# Patient Record
Sex: Female | Born: 1985
Health system: Southern US, Community
[De-identification: ages and names within clinical notes are randomized; demographics above are authoritative.]

## PROBLEM LIST (undated history)

## (undated) DIAGNOSIS — IMO0002 Reserved for concepts with insufficient information to code with codable children: Secondary | ICD-10-CM

## (undated) DIAGNOSIS — D649 Anemia, unspecified: Secondary | ICD-10-CM

## (undated) DIAGNOSIS — T7840XA Allergy, unspecified, initial encounter: Secondary | ICD-10-CM

## (undated) HISTORY — DX: Reserved for concepts with insufficient information to code with codable children: IMO0002

## (undated) HISTORY — DX: Allergy, unspecified, initial encounter: T78.40XA

## (undated) HISTORY — PX: OTHER SURGICAL HISTORY: SHX169

## (undated) HISTORY — DX: Anemia, unspecified: D64.9

---

## 2019-12-19 DIAGNOSIS — M79672 Pain in left foot: Secondary | ICD-10-CM | POA: Diagnosis not present

## 2019-12-19 DIAGNOSIS — M25572 Pain in left ankle and joints of left foot: Secondary | ICD-10-CM | POA: Diagnosis not present

## 2020-11-27 ENCOUNTER — Emergency Department (HOSPITAL_COMMUNITY)
Admission: EM | Admit: 2020-11-27 | Discharge: 2020-11-27 | Disposition: A | Discharge status: Home / Self Care | Payer: Federal, State, Local not specified - PPO | Attending: Emergency Medicine | Admitting: Emergency Medicine

## 2020-11-27 ENCOUNTER — Encounter (HOSPITAL_COMMUNITY): Payer: Self-pay | Admitting: Emergency Medicine

## 2020-11-27 DIAGNOSIS — N946 Dysmenorrhea, unspecified: Secondary | ICD-10-CM | POA: Insufficient documentation

## 2020-11-27 DIAGNOSIS — R1084 Generalized abdominal pain: Secondary | ICD-10-CM | POA: Diagnosis not present

## 2020-11-27 DIAGNOSIS — M545 Low back pain, unspecified: Secondary | ICD-10-CM | POA: Diagnosis not present

## 2020-11-27 LAB — CBC WITH DIFFERENTIAL/PLATELET
Abs Immature Granulocytes: 0.02 10*3/uL (ref 0.00–0.07)
Basophils Absolute: 0 10*3/uL (ref 0.0–0.1)
Basophils Relative: 1 %
Eosinophils Absolute: 0.1 10*3/uL (ref 0.0–0.5)
Eosinophils Relative: 1 %
HCT: 36.5 % (ref 36.0–46.0)
Hemoglobin: 11.9 g/dL — ABNORMAL LOW (ref 12.0–15.0)
Immature Granulocytes: 0 %
Lymphocytes Relative: 27 %
Lymphs Abs: 1.8 10*3/uL (ref 0.7–4.0)
MCH: 28.5 pg (ref 26.0–34.0)
MCHC: 32.6 g/dL (ref 30.0–36.0)
MCV: 87.5 fL (ref 80.0–100.0)
Monocytes Absolute: 0.4 10*3/uL (ref 0.1–1.0)
Monocytes Relative: 5 %
Neutro Abs: 4.6 10*3/uL (ref 1.7–7.7)
Neutrophils Relative %: 66 %
Platelets: 408 10*3/uL — ABNORMAL HIGH (ref 150–400)
RBC: 4.17 MIL/uL (ref 3.87–5.11)
RDW: 15.9 % — ABNORMAL HIGH (ref 11.5–15.5)
WBC: 6.9 10*3/uL (ref 4.0–10.5)
nRBC: 0 % (ref 0.0–0.2)

## 2020-11-27 LAB — COMPREHENSIVE METABOLIC PANEL
ALT: 14 U/L (ref 0–44)
AST: 14 U/L — ABNORMAL LOW (ref 15–41)
Albumin: 3.7 g/dL (ref 3.5–5.0)
Alkaline Phosphatase: 45 U/L (ref 38–126)
Anion gap: 9 (ref 5–15)
BUN: 7 mg/dL (ref 6–20)
CO2: 23 mmol/L (ref 22–32)
Calcium: 8.9 mg/dL (ref 8.9–10.3)
Chloride: 106 mmol/L (ref 98–111)
Creatinine, Ser: 0.81 mg/dL (ref 0.44–1.00)
GFR, Estimated: >60 mL/min (ref >60)
Glucose, Bld: 98 mg/dL (ref 70–99)
Potassium: 3.8 mmol/L (ref 3.5–5.1)
Sodium: 138 mmol/L (ref 135–145)
Total Bilirubin: 0.6 mg/dL (ref 0.3–1.2)
Total Protein: 6.6 g/dL (ref 6.5–8.1)

## 2020-11-27 LAB — LIPASE, BLOOD: Lipase: 42 U/L (ref 11–51)

## 2020-11-27 NOTE — ED Triage Notes (Signed)
Pt sent from UC for further workup. Last week pt was having nausea, lack of appetite and low back pain. Pt not having any complaints at this time. Pt on cycle at this time and having some cramps.

## 2020-11-27 NOTE — Discharge Instructions (Addendum)
It was a pleasure caring for you today!  You came in for prior abdominal pain, which I am glad has resolved.  Your urine test today did not show any signs of kidney infection or UTI.  Normal blood test to check your kidneys and blood counts.  Recommend following up with a PCP and establishing care in order to follow-up on your irregular menstrual cycles and symptoms.  If interested you can contact the family medicine clinic across the street to establish care. phone [phone] at [address].

## 2020-11-27 NOTE — ED Provider Notes (Signed)
Emergency Medicine Provider Triage Evaluation Note  Madison Mejia , a 35 y.o. female  was evaluated in triage.  Pt complains of nausea she has been having multiple episodes of emesis for the last week.  States that she had abdominal pain that radiated to the back, but that is resolved.  She had 1 episode of hematemesis earlier today.  She has not had shortness of breath or chest pain.  No dysuria today, but had some last week.  No hematuria, states her cycles have been closer together than normal..  Review of Systems  Positive: Nausea, vomiting, hematemesis  Negative: Chest pain, shortness of breath  Physical Exam  BP (!) 129/93   Pulse 84   Temp 98.2 F (36.8 C) (Oral)   Resp 14   Ht 5\' 3"  (1.6 m)   Wt 68 kg   SpO2 100%   BMI 26.57 kg/m  Gen:   Awake, no distress   Resp:  Normal effort  MSK:   Moves extremities without difficulty  Other:  No CVA tenderness  Medical Decision Making  Medically screening exam initiated at 12:23 PM.  Appropriate orders placed.  Maire Govan was informed that the remainder of the evaluation will be completed by another provider, this initial triage assessment does not replace that evaluation, and the importance of remaining in the ED until their evaluation is complete.     Lilia Pro, PA-C 11/27/20 1224    11/29/20, MD 11/27/20 [phone]

## 2020-11-27 NOTE — ED Provider Notes (Signed)
[facility] EMERGENCY DEPARTMENT Provider Note   CSN: [id number] Arrival date & time: 11/27/20  1209     History Chief Complaint  Patient presents with   Nausea    Madison Mejia is a 35 y.o. female with no PMH who presents for abdominal pain that started yesterday and has since resolved. She states the pain was diffuse and similar to her menstrual cramps. She then started her cycle yesterday which is abnormal for her since she was just on it 3 weeks ago. She also endorses 3 days of nausea and vomiting about a week ago that has also resolved. States she noted a little blood when she threw up last week. Denies burning or frequent urination, and has no concern for STI. No recent fever or infection. Denis CP, SOB, palpitations. Normal BM and urination.   Denies cigarette or recreational drug use. Occasional alcohol use. Does not have a PCP. Does not take any medication.    History reviewed. No pertinent past medical history.  There are no problems to display for this patient.   History reviewed. No pertinent surgical history.   OB History   No obstetric history on file.     No family history on file.     Home Medications Prior to Admission medications   Not on File    Allergies    Patient has no allergy information on record.  Review of Systems   Review of Systems  Gastrointestinal:  Positive for abdominal pain.  Genitourinary:  Positive for vaginal bleeding. Negative for dysuria.  Neurological:  Negative for dizziness, weakness and light-headedness.  All other systems reviewed and are negative.  Physical Exam Updated Vital Signs BP 113/80   Pulse 80   Temp 98.2 F (36.8 C) (Oral)   Resp 14   Ht 5\' 3"  (1.6 m)   Wt 68 kg   SpO2 99%   BMI 26.57 kg/m   Physical Exam Constitutional:      General: She is not in acute distress.    Appearance: Normal appearance.  HENT:     Head: Normocephalic and atraumatic.  Eyes:     Extraocular  Movements: Extraocular movements intact.     Conjunctiva/sclera: Conjunctivae normal.  Cardiovascular:     Rate and Rhythm: Normal rate and regular rhythm.     Pulses: Normal pulses.     Heart sounds: Normal heart sounds.  Pulmonary:     Effort: Pulmonary effort is normal.     Breath sounds: Normal breath sounds.  Abdominal:     General: There is no distension.     Palpations: Abdomen is soft.     Tenderness: There is no abdominal tenderness.  Musculoskeletal:     Cervical back: Neck supple.  Skin:    General: Skin is warm and dry.  Neurological:     General: No focal deficit present.     Mental Status: She is alert.  Psychiatric:        Mood and Affect: Mood normal.        Behavior: Behavior normal.    ED Results / Procedures / Treatments   Labs (all labs ordered are listed, but only abnormal results are displayed) Labs Reviewed  CBC WITH DIFFERENTIAL/PLATELET - Abnormal; Notable for the following components:      Result Value   Hemoglobin 11.9 (*)    RDW 15.9 (*)    Platelets 408 (*)    All other components within normal limits  COMPREHENSIVE METABOLIC PANEL - Abnormal;  Notable for the following components:   AST 14 (*)    All other components within normal limits  LIPASE, BLOOD    EKG None  Radiology No results found.  Procedures Procedures   Medications Ordered in ED Medications - No data to display  ED Course  I have reviewed the triage vital signs and the nursing notes.  Pertinent labs & imaging results that were available during my care of the patient were reviewed by me and considered in my medical decision making (see chart for details).    MDM Rules/Calculators/A&P                           Patient is a 35 yo who presents after abdominal pain x1 day that has since resolved. Went to urgent care earlier today and did a pregnancy test which was negative and a UA with hematuria. Recommended coming to the ED for further workup.States she started her  menses yesterday which was a week early and pain likely attributable to her cycle. On presentation vitals are stable, she is well appearing, without abdominal pain, and with a normal physical exam. CMP and lipase normal. Hgb 11.9  Discharged patient with recommendation for PCP follow-up to check on her irregular menses. Discussed option of contraception for her bleeding.  Provided the contact information for the family medicine clinic. Patient had no further questions or concerns  Final Clinical Impression(s) / ED Diagnoses Final diagnoses:  Dysmenorrhea  Generalized abdominal pain    Rx / DC Orders ED Discharge Orders     None        Cora Collum, DO 11/27/20 1721    Gwyneth Sprout, MD 11/27/20 [phone]

## 2020-12-15 ENCOUNTER — Ambulatory Visit: Payer: Federal, State, Local not specified - PPO | Admitting: Internal Medicine

## 2020-12-15 ENCOUNTER — Encounter: Payer: Self-pay | Admitting: Internal Medicine

## 2020-12-15 ENCOUNTER — Other Ambulatory Visit: Payer: Self-pay

## 2020-12-15 VITALS — BP 111/68 | HR 70 | Ht 63.0 in | Wt 150.6 lb

## 2020-12-15 DIAGNOSIS — N946 Dysmenorrhea, unspecified: Secondary | ICD-10-CM

## 2020-12-15 MED ORDER — FLUOXETINE HCL 10 MG PO TABS: 10.0000 mg | ORAL_TABLET | Freq: Every day | ORAL | 3 refills | Status: DC

## 2020-12-15 NOTE — Addendum Note (Signed)
Addended by: Jobie Quaker on: 12/15/2020 02:56 PM   Modules accepted: Orders

## 2020-12-15 NOTE — Progress Notes (Signed)
New Patient Office Visit  Subjective:  Patient ID: Madison Mejia, female    DOB: [birth year]  Age: 35 y.o. MRN: [id number]  CC:  Chief Complaint  Patient presents with   Menstrual Problem    Patient having irregular periods. Patient states that they have came 3 weeks apart. Complains of cramping and back pain. Patient was seen at urgent care and had labs and neg pregnancy test.     HPI Patient presents for menstrual cramps  History reviewed. No pertinent past medical history.   Current Outpatient Medications:    FLUoxetine (PROZAC) 10 MG tablet, Take 1 tablet (10 mg total) by mouth daily., Disp: 90 tablet, Rfl: 3   History reviewed. No pertinent surgical history.  History reviewed. No pertinent family history.  Social History   Socioeconomic History   Marital status: Single    Spouse name: Not on file   Number of children: Not on file   Years of education: Not on file   Highest education level: Not on file  Occupational History   Not on file  Tobacco Use   Smoking status: Never   Smokeless tobacco: Never  Substance and Sexual Activity   Alcohol use: Never   Drug use: Never   Sexual activity: Not Currently  Other Topics Concern   Not on file  Social History Narrative   Not on file   Social Determinants of Health   Financial Resource Strain: Not on file  Food Insecurity: Not on file  Transportation Needs: Not on file  Physical Activity: Not on file  Stress: Not on file  Social Connections: Not on file  Intimate Partner Violence: Not on file    ROS Review of Systems  Constitutional: Negative.   HENT: Negative.    Eyes: Negative.   Respiratory: Negative.    Cardiovascular: Negative.   Gastrointestinal: Negative.   Endocrine: Negative.   Genitourinary:  Positive for menstrual problem and pelvic pain. Negative for hematuria, vaginal bleeding, vaginal discharge and vaginal pain.  Musculoskeletal: Negative.   Skin: Negative.   Allergic/Immunologic:  Negative.   Neurological: Negative.   Hematological: Negative.   Psychiatric/Behavioral: Negative.    All other systems reviewed and are negative.  Objective:   Today's Vitals: BP 111/68   Pulse 70   Ht 5\' 3"  (1.6 m)   Wt 150 lb 9.6 oz (68.3 kg)   BMI 26.68 kg/m   Physical Exam Constitutional:      Appearance: Normal appearance. She is normal weight.  HENT:     Head: Normocephalic.     Nose: Nose normal.     Mouth/Throat:     Mouth: Mucous membranes are moist.  Eyes:     Pupils: Pupils are equal, round, and reactive to light.  Cardiovascular:     Rate and Rhythm: Normal rate and regular rhythm.  Pulmonary:     Effort: Pulmonary effort is normal.     Breath sounds: No rhonchi.  Abdominal:     General: There is no distension.  Musculoskeletal:        General: Normal range of motion.     Cervical back: Normal range of motion.  Skin:    General: Skin is warm.  Neurological:     General: No focal deficit present.     Mental Status: She is alert.  Psychiatric:        Mood and Affect: Mood normal.    Assessment & Plan:   Problem List Items Addressed This Visit  Genitourinary   Dysmenorrhea - Primary    We will start the patient on Prozac also refer her to OB/GYN       Relevant Medications   FLUoxetine (PROZAC) 10 MG tablet    Outpatient Encounter Medications as of 12/15/2020  Medication Sig   FLUoxetine (PROZAC) 10 MG tablet Take 1 tablet (10 mg total) by mouth daily.   No facility-administered encounter medications on file as of 12/15/2020.    Follow-up: No follow-ups on file.   Corky Downs, MD

## 2020-12-15 NOTE — Assessment & Plan Note (Signed)
We will start the patient on Prozac also refer her to OB/GYN

## 2020-12-29 ENCOUNTER — Encounter: Payer: Self-pay | Admitting: Obstetrics and Gynecology

## 2021-07-13 ENCOUNTER — Other Ambulatory Visit: Payer: Self-pay

## 2021-07-13 ENCOUNTER — Other Ambulatory Visit (HOSPITAL_COMMUNITY)
Admission: RE | Admit: 2021-07-13 | Discharge: 2021-07-13 | Disposition: A | Discharge status: Home / Self Care | Payer: Federal, State, Local not specified - PPO | Source: Ambulatory Visit | Attending: Obstetrics & Gynecology | Admitting: Obstetrics & Gynecology

## 2021-07-13 ENCOUNTER — Ambulatory Visit (INDEPENDENT_AMBULATORY_CARE_PROVIDER_SITE_OTHER): Payer: Federal, State, Local not specified - PPO | Admitting: Obstetrics & Gynecology

## 2021-07-13 ENCOUNTER — Encounter: Payer: Self-pay | Admitting: Obstetrics & Gynecology

## 2021-07-13 VITALS — BP 104/70 | HR 84 | Ht 63.0 in | Wt 136.0 lb

## 2021-07-13 DIAGNOSIS — N946 Dysmenorrhea, unspecified: Secondary | ICD-10-CM | POA: Diagnosis not present

## 2021-07-13 DIAGNOSIS — Z30011 Encounter for initial prescription of contraceptive pills: Secondary | ICD-10-CM

## 2021-07-13 DIAGNOSIS — Z01419 Encounter for gynecological examination (general) (routine) without abnormal findings: Secondary | ICD-10-CM | POA: Diagnosis not present

## 2021-07-13 MED ORDER — LEVONORGESTREL-ETHINYL ESTRAD 0.15-30 MG-MCG PO TABS: 1.0000 | ORAL_TABLET | Freq: Every day | ORAL | 11 refills | Status: DC

## 2021-07-13 NOTE — Progress Notes (Signed)
Patient ID: Madison Mejia, female   DOB: [birth year], 36 y.o.   MRN: [id number] ? ?Chief Complaint  ?Patient presents with  ? Gynecologic Exam  ? ? ?HPI ?Madison Mejia is a 36 y.o. female.  [id number] ?Patient's last menstrual period was 06/18/2021 (exact date). ?For one year she has had premenstrual pain and nausea and vomiting with 15 lb weight loss. She also has occasional diarrhea and constipation. No birth control method as she does not have a female partner for several years. She used OCP in college and did well with this. ?HPI ? ?Past Medical History:  ?Diagnosis Date  ? Anemia   ? ? ?History reviewed. No pertinent surgical history. ? ?History reviewed. No pertinent family history. ? ?Social History ?Social History  ? ?Tobacco Use  ? Smoking status: Never  ? Smokeless tobacco: Never  ?Vaping Use  ? Vaping Use: Never used  ?Substance Use Topics  ? Alcohol use: Yes  ? Drug use: Never  ? ? ?No Known Allergies ? ?Current Outpatient Medications  ?Medication Sig Dispense Refill  ? levonorgestrel-ethinyl estradiol (NORDETTE) 0.15-30 MG-MCG tablet Take 1 tablet by mouth daily. 28 tablet 11  ? FLUoxetine (PROZAC) 10 MG tablet Take 1 tablet (10 mg total) by mouth daily. (Patient not taking: Reported on 07/13/2021) 90 tablet 3  ? ?No current facility-administered medications for this visit.  ? ? ?Review of Systems ?Review of Systems  ?Constitutional:  Positive for unexpected weight change.  ?Cardiovascular: Negative.   ?Gastrointestinal:  Positive for constipation and diarrhea.  ?Genitourinary:  Positive for menstrual problem and pelvic pain.  ? ?Blood pressure 104/70, pulse 84, height 5\' 3"  (1.6 m), weight 136 lb (61.7 kg), last menstrual period 06/18/2021. ? ?Physical Exam ?Physical Exam ?Vitals and nursing note reviewed. Exam conducted with a chaperone present.  ?Constitutional:   ?   Appearance: Normal appearance.  ?Eyes:  ?   Pupils: Pupils are equal, round, and reactive to light.  ?Cardiovascular:  ?   Rate and  Rhythm: Normal rate.  ?Pulmonary:  ?   Effort: Pulmonary effort is normal.  ?Chest:  ?Breasts: ?   Right: Normal.  ?   Left: Normal.  ?Abdominal:  ?   General: Abdomen is flat.  ?   Palpations: Abdomen is soft.  ?Genitourinary: ?   General: Normal vulva.  ?   Exam position: Lithotomy position.  ?   Vagina: Normal.  ?   Cervix: Normal.  ?   Uterus: Normal.   ?   Adnexa: Right adnexa normal and left adnexa normal.  ?Skin: ?   General: Skin is warm and dry.  ?Neurological:  ?   Mental Status: She is alert.  ?Psychiatric:     ?   Mood and Affect: Mood normal.     ?   Behavior: Behavior normal.  ? ? ?Data Reviewed ?Office notes 2022 ? ?Assessment ?Dysmenorrhea - Plan: HepB+HepC+HIV Panel, RPR, levonorgestrel-ethinyl estradiol (NORDETTE) 0.15-30 MG-MCG tablet, US PELVIC COMPLETE WITH TRANSVAGINAL ? ?Well woman exam with routine gynecological exam - Plan: Cytology - PAP( [place]), Cervicovaginal ancillary only( [place]) ? ?Oral contraception initial prescription ? ? ?Plan ?We will try cycle control with OCP and f/u on pelvic US result with f/u in 3 months to review progress as she may need GI referral ? ? ? ?Madison Mejia ?07/13/2021, 9:59 AM ? ? ? ?

## 2021-07-13 NOTE — Progress Notes (Signed)
36 y.o New GYN presents for severe PMS, NV, pain, weight loss.  Periods are normal and last 5-7 days. ?

## 2021-07-14 LAB — HEPB+HEPC+HIV PANEL
HIV Screen 4th Generation wRfx: NONREACTIVE
Hep B C IgM: NEGATIVE
Hep B Core Total Ab: NEGATIVE
Hep B E Ab: NEGATIVE
Hep B E Ag: NEGATIVE
Hep B Surface Ab, Qual: NONREACTIVE
Hep C Virus Ab: NONREACTIVE
Hepatitis B Surface Ag: NEGATIVE

## 2021-07-14 LAB — CERVICOVAGINAL ANCILLARY ONLY
Chlamydia: NEGATIVE
Comment: NEGATIVE
Comment: NEGATIVE
Comment: NORMAL
Neisseria Gonorrhea: NEGATIVE
Trichomonas: NEGATIVE

## 2021-07-14 LAB — RPR: RPR Ser Ql: NONREACTIVE

## 2021-07-15 LAB — CYTOLOGY - PAP
Comment: NEGATIVE
Diagnosis: NEGATIVE
High risk HPV: NEGATIVE

## 2021-07-19 ENCOUNTER — Ambulatory Visit
Admission: RE | Admit: 2021-07-19 | Discharge: 2021-07-19 | Disposition: A | Discharge status: Home / Self Care | Payer: Federal, State, Local not specified - PPO | Source: Ambulatory Visit | Attending: Obstetrics & Gynecology | Admitting: Obstetrics & Gynecology

## 2021-07-19 DIAGNOSIS — D252 Subserosal leiomyoma of uterus: Secondary | ICD-10-CM | POA: Diagnosis not present

## 2021-07-19 DIAGNOSIS — N946 Dysmenorrhea, unspecified: Secondary | ICD-10-CM | POA: Diagnosis not present

## 2022-02-09 ENCOUNTER — Telehealth: Payer: Self-pay

## 2022-03-15 NOTE — Telephone Encounter (Signed)
NA

## 2022-05-26 ENCOUNTER — Encounter (HOSPITAL_COMMUNITY): Admission: EM | Disposition: A | Discharge status: Home / Self Care | Payer: Self-pay | Source: Home / Self Care

## 2022-05-26 ENCOUNTER — Emergency Department (HOSPITAL_COMMUNITY): Payer: Federal, State, Local not specified - PPO | Admitting: Anesthesiology

## 2022-05-26 ENCOUNTER — Other Ambulatory Visit: Payer: Self-pay

## 2022-05-26 ENCOUNTER — Encounter (HOSPITAL_COMMUNITY): Payer: Self-pay | Admitting: Emergency Medicine

## 2022-05-26 ENCOUNTER — Emergency Department (HOSPITAL_COMMUNITY): Payer: Federal, State, Local not specified - PPO

## 2022-05-26 ENCOUNTER — Inpatient Hospital Stay (HOSPITAL_COMMUNITY)
Admission: EM | Admit: 2022-05-26 | Discharge: 2022-06-02 | DRG: 328 | Disposition: A | Discharge status: Home / Self Care | Payer: Federal, State, Local not specified - PPO | Attending: Surgery | Admitting: Surgery

## 2022-05-26 DIAGNOSIS — D649 Anemia, unspecified: Secondary | ICD-10-CM | POA: Diagnosis not present

## 2022-05-26 DIAGNOSIS — R1084 Generalized abdominal pain: Secondary | ICD-10-CM | POA: Diagnosis not present

## 2022-05-26 DIAGNOSIS — R1010 Upper abdominal pain, unspecified: Secondary | ICD-10-CM | POA: Diagnosis not present

## 2022-05-26 DIAGNOSIS — K3189 Other diseases of stomach and duodenum: Principal | ICD-10-CM | POA: Diagnosis present

## 2022-05-26 DIAGNOSIS — K5900 Constipation, unspecified: Secondary | ICD-10-CM | POA: Diagnosis present

## 2022-05-26 DIAGNOSIS — K255 Chronic or unspecified gastric ulcer with perforation: Principal | ICD-10-CM | POA: Diagnosis present

## 2022-05-26 DIAGNOSIS — R110 Nausea: Secondary | ICD-10-CM | POA: Diagnosis not present

## 2022-05-26 DIAGNOSIS — R109 Unspecified abdominal pain: Secondary | ICD-10-CM | POA: Diagnosis not present

## 2022-05-26 DIAGNOSIS — B9681 Helicobacter pylori [H. pylori] as the cause of diseases classified elsewhere: Secondary | ICD-10-CM | POA: Diagnosis present

## 2022-05-26 DIAGNOSIS — K631 Perforation of intestine (nontraumatic): Secondary | ICD-10-CM | POA: Diagnosis not present

## 2022-05-26 DIAGNOSIS — K251 Acute gastric ulcer with perforation: Secondary | ICD-10-CM | POA: Diagnosis not present

## 2022-05-26 DIAGNOSIS — R1011 Right upper quadrant pain: Secondary | ICD-10-CM | POA: Diagnosis not present

## 2022-05-26 HISTORY — PX: LAPAROTOMY: SHX154

## 2022-05-26 LAB — URINALYSIS, ROUTINE W REFLEX MICROSCOPIC
Bilirubin Urine: NEGATIVE
Glucose, UA: NEGATIVE mg/dL
Hgb urine dipstick: NEGATIVE
Ketones, ur: 80 mg/dL — AB
Leukocytes,Ua: NEGATIVE
Nitrite: NEGATIVE
Protein, ur: NEGATIVE mg/dL
Specific Gravity, Urine: 1.034 — ABNORMAL HIGH (ref 1.005–1.030)
pH: 5 (ref 5.0–8.0)

## 2022-05-26 LAB — CBC WITH DIFFERENTIAL/PLATELET
Abs Immature Granulocytes: 0.03 10*3/uL (ref 0.00–0.07)
Basophils Absolute: 0 10*3/uL (ref 0.0–0.1)
Basophils Relative: 0 %
Eosinophils Absolute: 0 10*3/uL (ref 0.0–0.5)
Eosinophils Relative: 0 %
HCT: 32.7 % — ABNORMAL LOW (ref 36.0–46.0)
Hemoglobin: 10.4 g/dL — ABNORMAL LOW (ref 12.0–15.0)
Immature Granulocytes: 0 %
Lymphocytes Relative: 13 %
Lymphs Abs: 1.3 10*3/uL (ref 0.7–4.0)
MCH: 24.1 pg — ABNORMAL LOW (ref 26.0–34.0)
MCHC: 31.8 g/dL (ref 30.0–36.0)
MCV: 75.9 fL — ABNORMAL LOW (ref 80.0–100.0)
Monocytes Absolute: 0.5 10*3/uL (ref 0.1–1.0)
Monocytes Relative: 5 %
Neutro Abs: 7.9 10*3/uL — ABNORMAL HIGH (ref 1.7–7.7)
Neutrophils Relative %: 82 %
Platelets: 464 10*3/uL — ABNORMAL HIGH (ref 150–400)
RBC: 4.31 MIL/uL (ref 3.87–5.11)
RDW: 18.9 % — ABNORMAL HIGH (ref 11.5–15.5)
WBC: 9.7 10*3/uL (ref 4.0–10.5)
nRBC: 0 % (ref 0.0–0.2)

## 2022-05-26 LAB — COMPREHENSIVE METABOLIC PANEL
ALT: 11 U/L (ref 0–44)
AST: 24 U/L (ref 15–41)
Albumin: 3.7 g/dL (ref 3.5–5.0)
Alkaline Phosphatase: 41 U/L (ref 38–126)
Anion gap: 12 (ref 5–15)
BUN: 5 mg/dL — ABNORMAL LOW (ref 6–20)
CO2: 24 mmol/L (ref 22–32)
Calcium: 10 mg/dL (ref 8.9–10.3)
Chloride: 100 mmol/L (ref 98–111)
Creatinine, Ser: 0.79 mg/dL (ref 0.44–1.00)
GFR, Estimated: >60 mL/min (ref >60)
Glucose, Bld: 111 mg/dL — ABNORMAL HIGH (ref 70–99)
Potassium: 3.7 mmol/L (ref 3.5–5.1)
Sodium: 136 mmol/L (ref 135–145)
Total Bilirubin: 0.5 mg/dL (ref 0.3–1.2)
Total Protein: 6.9 g/dL (ref 6.5–8.1)

## 2022-05-26 LAB — LIPASE, BLOOD: Lipase: 35 U/L (ref 11–51)

## 2022-05-26 LAB — HCG, QUANTITATIVE, PREGNANCY: hCG, Beta Chain, Quant, S: <1 m[IU]/mL (ref <5)

## 2022-05-26 SURGERY — LAPAROTOMY, EXPLORATORY
Anesthesia: General

## 2022-05-26 MED ORDER — PROPOFOL 10 MG/ML IV BOLUS
INTRAVENOUS | Status: DC | PRN
  Administered 2022-05-26: 130 mg via INTRAVENOUS

## 2022-05-26 MED ORDER — IOHEXOL 350 MG/ML SOLN
75.0000 mL | Freq: Once | INTRAVENOUS | Status: AC | PRN
  Administered 2022-05-26: 75 mL via INTRAVENOUS

## 2022-05-26 MED ORDER — ROCURONIUM BROMIDE 10 MG/ML (PF) SYRINGE
PREFILLED_SYRINGE | INTRAVENOUS | Status: AC
  Filled 2022-05-26: qty 10

## 2022-05-26 MED ORDER — ONDANSETRON HCL 4 MG/2ML IJ SOLN
INTRAMUSCULAR | Status: AC
  Filled 2022-05-26: qty 2

## 2022-05-26 MED ORDER — 0.9 % SODIUM CHLORIDE (POUR BTL) OPTIME
TOPICAL | Status: DC | PRN
  Administered 2022-05-26: 2000 mL

## 2022-05-26 MED ORDER — PANTOPRAZOLE SODIUM 40 MG IV SOLR: 40.0000 mg | Freq: Two times a day (BID) | INTRAVENOUS | Status: DC

## 2022-05-26 MED ORDER — PANTOPRAZOLE INFUSION (NEW) - SIMPLE MED
8.0000 mg/h | INTRAVENOUS | Status: DC
  Filled 2022-05-26 (×2): qty 100

## 2022-05-26 MED ORDER — HYDROMORPHONE HCL 1 MG/ML IJ SOLN
0.5000 mg | Freq: Once | INTRAMUSCULAR | Status: AC
  Administered 2022-05-26: 0.5 mg via INTRAVENOUS
  Filled 2022-05-26: qty 1

## 2022-05-26 MED ORDER — PANTOPRAZOLE 80MG IVPB - SIMPLE MED
80.0000 mg | Freq: Once | INTRAVENOUS | Status: AC
  Administered 2022-05-26 – 2022-05-27 (×2): 80 mg via INTRAVENOUS
  Filled 2022-05-26: qty 100

## 2022-05-26 MED ORDER — PIPERACILLIN-TAZOBACTAM 3.375 G IVPB 30 MIN
3.3750 g | Freq: Once | INTRAVENOUS | Status: AC
  Administered 2022-05-26: 3.375 g via INTRAVENOUS
  Filled 2022-05-26: qty 50

## 2022-05-26 MED ORDER — LACTATED RINGERS IV BOLUS
1000.0000 mL | Freq: Once | INTRAVENOUS | Status: AC
  Administered 2022-05-26: 1000 mL via INTRAVENOUS

## 2022-05-26 MED ORDER — SUCCINYLCHOLINE CHLORIDE 200 MG/10ML IV SOSY
PREFILLED_SYRINGE | INTRAVENOUS | Status: AC
  Filled 2022-05-26: qty 10

## 2022-05-26 MED ORDER — LACTATED RINGERS IV SOLN: INTRAVENOUS | Status: DC | PRN

## 2022-05-26 MED ORDER — LIDOCAINE 2% (20 MG/ML) 5 ML SYRINGE
INTRAMUSCULAR | Status: AC
  Filled 2022-05-26: qty 5

## 2022-05-26 MED ORDER — MIDAZOLAM HCL 2 MG/2ML IJ SOLN
INTRAMUSCULAR | Status: AC
  Filled 2022-05-26: qty 2

## 2022-05-26 MED ORDER — FENTANYL CITRATE (PF) 250 MCG/5ML IJ SOLN
INTRAMUSCULAR | Status: AC
  Filled 2022-05-26: qty 5

## 2022-05-26 MED ORDER — DEXAMETHASONE SODIUM PHOSPHATE 10 MG/ML IJ SOLN
INTRAMUSCULAR | Status: AC
  Filled 2022-05-26: qty 1

## 2022-05-26 MED ORDER — PROPOFOL 10 MG/ML IV BOLUS
INTRAVENOUS | Status: AC
  Filled 2022-05-26: qty 20

## 2022-05-26 MED ORDER — ROCURONIUM 10MG/ML (10ML) SYRINGE FOR MEDFUSION PUMP - OPTIME
INTRAVENOUS | Status: DC | PRN
  Administered 2022-05-26: 50 mg via INTRAVENOUS

## 2022-05-26 MED ORDER — SUCCINYLCHOLINE 20MG/ML (10ML) SYRINGE FOR MEDFUSION PUMP - OPTIME
INTRAMUSCULAR | Status: DC | PRN
  Administered 2022-05-26: 100 mg via INTRAVENOUS

## 2022-05-26 MED ORDER — MIDAZOLAM HCL 2 MG/2ML IJ SOLN
INTRAMUSCULAR | Status: DC | PRN
  Administered 2022-05-26: 2 mg via INTRAVENOUS

## 2022-05-26 MED ORDER — PHENYLEPHRINE HCL (PRESSORS) 10 MG/ML IV SOLN
INTRAVENOUS | Status: DC | PRN
  Administered 2022-05-26: 160 ug via INTRAVENOUS

## 2022-05-26 MED ORDER — FENTANYL CITRATE PF 50 MCG/ML IJ SOSY
50.0000 ug | PREFILLED_SYRINGE | Freq: Once | INTRAMUSCULAR | Status: AC
  Administered 2022-05-26: 50 ug via INTRAVENOUS
  Filled 2022-05-26: qty 1

## 2022-05-26 MED ORDER — FENTANYL CITRATE (PF) 250 MCG/5ML IJ SOLN
INTRAMUSCULAR | Status: DC | PRN
  Administered 2022-05-26: 100 ug via INTRAVENOUS

## 2022-05-26 MED ORDER — LIDOCAINE HCL (CARDIAC) PF 100 MG/5ML IV SOSY
PREFILLED_SYRINGE | INTRAVENOUS | Status: DC | PRN
  Administered 2022-05-26: 50 mg via INTRAVENOUS

## 2022-05-26 SURGICAL SUPPLY — 44 items
BAG COUNTER SPONGE SURGICOUNT (BAG) ×1 IMPLANT
BLADE CLIPPER SURG (BLADE) IMPLANT
BNDG GAUZE DERMACEA FLUFF 4 (GAUZE/BANDAGES/DRESSINGS) IMPLANT
CANISTER SUCT 3000ML PPV (MISCELLANEOUS) ×1 IMPLANT
CHLORAPREP W/TINT 26 (MISCELLANEOUS) ×1 IMPLANT
COVER SURGICAL LIGHT HANDLE (MISCELLANEOUS) ×1 IMPLANT
DRAIN CHANNEL 19F RND (DRAIN) IMPLANT
DRAPE LAPAROSCOPIC ABDOMINAL (DRAPES) ×1 IMPLANT
DRAPE WARM FLUID 44X44 (DRAPES) ×1 IMPLANT
ELECT BLADE 6.5 EXT (BLADE) IMPLANT
ELECT REM PT RETURN 9FT ADLT (ELECTROSURGICAL) ×1
ELECTRODE REM PT RTRN 9FT ADLT (ELECTROSURGICAL) ×1 IMPLANT
EVACUATOR SILICONE 100CC (DRAIN) IMPLANT
GAUZE PAD ABD 8X10 STRL (GAUZE/BANDAGES/DRESSINGS) IMPLANT
GAUZE SPONGE 4X4 12PLY STRL (GAUZE/BANDAGES/DRESSINGS) IMPLANT
GLOVE BIO SURGEON STRL SZ7.5 (GLOVE) ×1 IMPLANT
GLOVE INDICATOR 8.0 STRL GRN (GLOVE) ×1 IMPLANT
GOWN STRL REUS W/ TWL LRG LVL3 (GOWN DISPOSABLE) ×1 IMPLANT
GOWN STRL REUS W/ TWL XL LVL3 (GOWN DISPOSABLE) ×1 IMPLANT
GOWN STRL REUS W/TWL LRG LVL3 (GOWN DISPOSABLE) ×1
GOWN STRL REUS W/TWL XL LVL3 (GOWN DISPOSABLE) ×1
HANDLE SUCTION POOLE (INSTRUMENTS) ×1 IMPLANT
KIT BASIN OR (CUSTOM PROCEDURE TRAY) ×1 IMPLANT
KIT TURNOVER KIT B (KITS) ×1 IMPLANT
LIGASURE IMPACT 36 18CM CVD LR (INSTRUMENTS) IMPLANT
NS IRRIG 1000ML POUR BTL (IV SOLUTION) ×2 IMPLANT
PACK GENERAL/GYN (CUSTOM PROCEDURE TRAY) ×1 IMPLANT
PAD ARMBOARD 7.5X6 YLW CONV (MISCELLANEOUS) ×1 IMPLANT
PENCIL SMOKE EVACUATOR (MISCELLANEOUS) ×1 IMPLANT
SPECIMEN JAR LARGE (MISCELLANEOUS) IMPLANT
SPONGE T-LAP 18X18 ~~LOC~~+RFID (SPONGE) IMPLANT
STAPLER VISISTAT 35W (STAPLE) ×1 IMPLANT
SUCTION POOLE HANDLE (INSTRUMENTS) ×1
SUT ETHILON 2 0 FS 18 (SUTURE) IMPLANT
SUT PDS AB 1 TP1 96 (SUTURE) ×2 IMPLANT
SUT SILK 2 0 SH CR/8 (SUTURE) ×1 IMPLANT
SUT SILK 2 0 TIES 10X30 (SUTURE) ×1 IMPLANT
SUT SILK 3 0 SH CR/8 (SUTURE) ×1 IMPLANT
SUT SILK 3 0 TIES 10X30 (SUTURE) ×1 IMPLANT
SYSTEM CHEST DRAIN TLS 7FR (DRAIN) IMPLANT
TOWEL GREEN STERILE (TOWEL DISPOSABLE) ×1 IMPLANT
TRAY FOLEY MTR SLVR 14FR STAT (SET/KITS/TRAYS/PACK) IMPLANT
TRAY FOLEY MTR SLVR 16FR STAT (SET/KITS/TRAYS/PACK) ×1 IMPLANT
YANKAUER SUCT BULB TIP NO VENT (SUCTIONS) IMPLANT

## 2022-05-26 NOTE — ED Provider Triage Note (Signed)
Emergency Medicine Provider Triage Evaluation Note  Madison Mejia , a 37 y.o. female  was evaluated in triage.  Pt complains of diffuse but mainly upper abdominal pain for the past 4 days but worsening this morning. Patient had to stop driving on the way over here because of the pain. Mentions two emesis episodes over the past 4 days. Reports some constipation. No fevers, black or bloody stools, or urinary complains. No chest pain or SOB.  Review of Systems  Positive:  Negative:   Physical Exam  BP (!) 119/90 (BP Location: Right Arm)   Pulse 99   Temp 99.1 F (37.3 C)   Resp 16   Ht 5\' 3"  (1.6 m)   Wt 56.7 kg   LMP 05/09/2022   SpO2 100%   BMI 22.14 kg/m  Gen:   Awake, no distress   Resp:  Normal effort  MSK:   Moves extremities without difficulty  Other:  Diffuse abdominal tenderness. Soft. No guarding or rebound  Medical Decision Making  Medically screening exam initiated at 3:48 PM.  Appropriate orders placed.  Rosalynn Sergent was informed that the remainder of the evaluation will be completed by another provider, this initial triage assessment does not replace that evaluation, and the importance of remaining in the ED until their evaluation is complete.  CT and labs ordered   Sherrell Puller, [place] 05/26/22 1607

## 2022-05-26 NOTE — ED Provider Notes (Addendum)
[facility] EMERGENCY DEPARTMENT Provider Note   CSN: [id number] Arrival date & time: 05/26/22  1546     History  Chief Complaint  Patient presents with   Abdominal Pain    Madison Mejia is a 37 y.o. female.  Pt complains of severe abdominal pain for the past 4 days.  Pt points to epigastric area as area of pain    The history is provided by the patient and a parent. No language interpreter was used.  Abdominal Pain Pain location:  RUQ Pain quality: aching   Pain radiates to:  Does not radiate Pain severity:  Severe Onset quality:  Gradual Duration:  4 days Timing:  Constant Progression:  Worsening Chronicity:  New Relieved by:  Nothing Worsened by:  Nothing Ineffective treatments:  None tried Associated symptoms: nausea        Home Medications Prior to Admission medications   Medication Sig Start Date End Date Taking? Authorizing Provider  FLUoxetine (PROZAC) 10 MG tablet Take 1 tablet (10 mg total) by mouth daily. Patient not taking: Reported on 07/13/2021 12/15/20   Cletis Athens, MD  levonorgestrel-ethinyl estradiol (NORDETTE) 0.15-30 MG-MCG tablet Take 1 tablet by mouth daily. 07/13/21   Woodroe Mode, MD      Allergies    Patient has no known allergies.    Review of Systems   Review of Systems  Gastrointestinal:  Positive for abdominal pain and nausea.  All other systems reviewed and are negative.   Physical Exam Updated Vital Signs BP 122/80   Pulse (!) 103   Temp 98.3 F (36.8 C) (Oral)   Resp 20   Ht 5\' 3"  (1.6 m)   Wt 56.7 kg   LMP 05/09/2022   SpO2 100%   BMI 22.14 kg/m  Physical Exam Vitals and nursing note reviewed.  Constitutional:      Appearance: She is well-developed.  HENT:     Head: Normocephalic.  Cardiovascular:     Rate and Rhythm: Tachycardia present.  Pulmonary:     Effort: Pulmonary effort is normal.  Abdominal:     General: There is no distension.     Tenderness: There is generalized abdominal  tenderness and tenderness in the epigastric area.  Musculoskeletal:        General: Normal range of motion.     Cervical back: Normal range of motion.  Skin:    General: Skin is warm.  Neurological:     General: No focal deficit present.     Mental Status: She is alert and oriented to person, place, and time.  Psychiatric:        Mood and Affect: Mood normal.     ED Results / Procedures / Treatments   Labs (all labs ordered are listed, but only abnormal results are displayed) Labs Reviewed  CBC WITH DIFFERENTIAL/PLATELET - Abnormal; Notable for the following components:      Result Value   Hemoglobin 10.4 (*)    HCT 32.7 (*)    MCV 75.9 (*)    MCH 24.1 (*)    RDW 18.9 (*)    Platelets 464 (*)    Neutro Abs 7.9 (*)    All other components within normal limits  COMPREHENSIVE METABOLIC PANEL - Abnormal; Notable for the following components:   Glucose, Bld 111 (*)    BUN 5 (*)    All other components within normal limits  URINALYSIS, ROUTINE W REFLEX MICROSCOPIC - Abnormal; Notable for the following components:   APPearance HAZY (*)  Specific Gravity, Urine 1.034 (*)    Ketones, ur 80 (*)    All other components within normal limits  LIPASE, BLOOD  HCG, QUANTITATIVE, PREGNANCY  I-STAT CHEM 8, ED  I-STAT BETA HCG BLOOD, ED ([facility], [facility], [facility] ONLY)    EKG None  Radiology CT ABDOMEN PELVIS W CONTRAST  Result Date: 05/26/2022 CLINICAL DATA:  Acute abdominal pain for several days, initial encounter EXAM: CT ABDOMEN AND PELVIS WITH CONTRAST TECHNIQUE: Multidetector CT imaging of the abdomen and pelvis was performed using the standard protocol following bolus administration of intravenous contrast. RADIATION DOSE REDUCTION: This exam was performed according to the departmental dose-optimization program which includes automated exposure control, adjustment of the mA and/or kV according to patient size and/or use of iterative reconstruction technique. CONTRAST:  28mL OMNIPAQUE IOHEXOL  350 MG/ML SOLN COMPARISON:  None Available. FINDINGS: Lower chest: Lung bases are free of acute infiltrate or sizable effusion. Hepatobiliary: Gallbladder is decompressed. The liver is within normal limits. Mild perihepatic fluid is noted. Pancreas: Unremarkable. No pancreatic ductal dilatation or surrounding inflammatory changes. Spleen: Normal in size without focal abnormality. Adrenals/Urinary Tract: Adrenal glands are within normal limits. Kidneys demonstrate a normal enhancement pattern bilaterally. No renal calculi or obstructive changes are seen. The bladder is well distended. Stomach/Bowel: No obstructive or inflammatory changes of the colon are seen. Fecal material is noted scattered throughout colon. The appendix is within normal limits. Small bowel is unremarkable. The stomach demonstrates significant wall edema and there is a large deep crater ulcer identified in the midportion of the stomach. The ulcer extends to the gastric wall and there is evidence of free fluid and free air within the abdomen consistent with a perforated gastric ulcer. There also findings suggestive of a posterior wall ulcer at the same level. No other definitive ulcers are seen. Vascular/Lymphatic: No significant vascular findings are present. No enlarged abdominal or pelvic lymph nodes. Reproductive: Uterus is well visualized and demonstrates multiple lobular lesions consistent with uterine fibroids. The largest of these measures approximately 2.5 cm. No definitive adnexal mass is seen. Other: Considerable free fluid is noted within the pelvis as well as in the upper abdomen consistent with the perforated gastric ulcer. Mild free air is noted as well. Some peritoneal thickening is noted likely related to inflammatory change. Musculoskeletal: Bony structures are within normal limits. IMPRESSION: Considerable gastric edema is noted with evidence of mere image anterior and posterior wall gastric ulcers. The anterior gastric ulcer is  perforated with a considerable amount of free air and the fluid within the abdomen. The posterior ulcer extends to the gastric wall although no perforation is appreciated. Uterine fibroid change. Critical Value/emergent results were called by telephone at the time of interpretation on 05/26/2022 at 9:42 pm to RILEY RANSOM, PA , who verbally acknowledged these results. Electronically Signed   By: Alcide Clever M.D.   On: 05/26/2022 21:45    Procedures .Critical Care  Performed by: Elson Areas, PA-C Authorized by: Elson Areas, PA-C   Critical care provider statement:    Critical care time (minutes):  30   Critical care start time:  05/26/2022 9:00 PM   Critical care end time:  04/25/2022 10:00 PM   Critical care time was exclusive of:  Separately billable procedures and treating other patients and teaching time   Critical care was necessary to treat or prevent imminent or life-threatening deterioration of the following conditions:  Cardiac failure, CNS failure or compromise, dehydration, sepsis and shock   Critical care  was time spent personally by me on the following activities:  Blood draw for specimens, development of treatment plan with patient or surrogate, discussions with consultants, evaluation of patient's response to treatment, pulse oximetry, ordering and review of radiographic studies, ordering and review of laboratory studies and ordering and performing treatments and interventions   Care discussed with: admitting provider       Medications Ordered in ED Medications  piperacillin-tazobactam (ZOSYN) IVPB 3.375 g (has no administration in time range)  pantoprazole (PROTONIX) 80 mg /NS 100 mL IVPB (has no administration in time range)  pantoprozole (PROTONIX) 80 mg /NS 100 mL infusion (has no administration in time range)  pantoprazole (PROTONIX) injection 40 mg (has no administration in time range)  HYDROmorphone (DILAUDID) injection 0.5 mg (has no administration in time range)   lactated ringers bolus 1,000 mL (1,000 mLs Intravenous New Bag/Given 05/26/22 1959)  fentaNYL (SUBLIMAZE) injection 50 mcg (50 mcg Intravenous Given 05/26/22 1959)  iohexol (OMNIPAQUE) 350 MG/ML injection 75 mL (75 mLs Intravenous Contrast Given 05/26/22 2124)    ED Course/ Medical Decision Making/ A&P Clinical Course as of 05/26/22 2200  Thu May 26, 2022  2055 Called CT. They report they are coming to get the patient now.  [RR]  [id number] 37 year old female with upper abdominal pain.  She is tachycardic here and quite tender epigastrium.  CT showing perforation.  Getting labs antibiotics PPI and surgical consult. [MB]    Clinical Course User Index [MB] Hayden Rasmussen, MD [RR] Sherrell Puller, PA-C                             Medical Decision Making Pt complains of severe abdominal pain.  Pt reports pain began 4 days ago and progressively worsened.    Amount and/or Complexity of Data Reviewed Independent Historian: parent    Details: Pt is here with Mother who is supportive External Data Reviewed: notes.    Details: Primary care notes reviewed Labs: ordered. Decision-making details documented in ED Course.    Details: Labs ordered reviewed and interpreted.  Pt's hemoglobin is 10.4   Radiology: ordered and independent interpretation performed. Decision-making details documented in ED Course.    Details: Ct scan shows perforated gastric ulcer.   Discussion of management or test interpretation with external provider(s): I spoke to Dr. Tanna Savoy surgeon on call.  He request zosyn, ng tube.  He will see pt here for evaluation   Risk Prescription drug management.           Final Clinical Impression(s) / ED Diagnoses Final diagnoses:  Gastric perforation, acute    Rx / DC Orders ED Discharge Orders     None         Sidney Ace 05/27/22 1029    Hayden Rasmussen, MD 05/27/22 [place], [place] 05/27/22 1058    Hayden Rasmussen,  MD 05/27/22 1324

## 2022-05-26 NOTE — Anesthesia Preprocedure Evaluation (Signed)
Anesthesia Evaluation  Patient identified by MRN, date of birth, ID band Patient awake    Reviewed: Allergy & Precautions, Patient's Chart, lab work & pertinent test results  Airway Mallampati: I  TM Distance: >3 FB Neck ROM: Full    Dental no notable dental hx.    Pulmonary neg pulmonary ROS   Pulmonary exam normal        Cardiovascular negative cardio ROS  Rhythm:Regular Rate:Normal     Neuro/Psych negative neurological ROS  negative psych ROS   GI/Hepatic Neg liver ROS,,,Perforated gastric ulcer   Endo/Other  negative endocrine ROS    Renal/GU negative Renal ROS  negative genitourinary   Musculoskeletal negative musculoskeletal ROS (+)    Abdominal Normal abdominal exam  (+)   Peds  Hematology  (+) Blood dyscrasia, anemia Lab Results      Component                Value               Date                      WBC                      9.7                 05/26/2022                HGB                      10.4 (L)            05/26/2022                HCT                      32.7 (L)            05/26/2022                MCV                      75.9 (L)            05/26/2022                PLT                      464 (H)             05/26/2022             Lab Results      Component                Value               Date                      NA                       136                 05/26/2022                K                        3.7  05/26/2022                CO2                      24                  05/26/2022                GLUCOSE                  111 (H)             05/26/2022                BUN                      5 (L)               05/26/2022                CREATININE               0.79                05/26/2022                CALCIUM                  10.0                05/26/2022                GFRNONAA                 >60                 05/26/2022              Anesthesia  Other Findings   Reproductive/Obstetrics                             Anesthesia Physical Anesthesia Plan  ASA: 2 and emergent  Anesthesia Plan: General   Post-op Pain Management:    Induction: Intravenous and Rapid sequence  PONV Risk Score and Plan: 3 and Ondansetron, Dexamethasone, Midazolam and Treatment may vary due to age or medical condition  Airway Management Planned: Mask and Oral ETT  Additional Equipment: None  Intra-op Plan:   Post-operative Plan: Extubation in OR  Informed Consent: I have reviewed the patients History and Physical, chart, labs and discussed the procedure including the risks, benefits and alternatives for the proposed anesthesia with the patient or authorized representative who has indicated his/her understanding and acceptance.     Dental advisory given  Plan Discussed with: CRNA  Anesthesia Plan Comments:        Anesthesia Quick Evaluation

## 2022-05-26 NOTE — H&P (Signed)
CC: Abdominal pain  HPI: Madison Mejia is an 37 y.o. female with no reported medical hx -presented to the emergency department earlier today with 4-day history of progressively worsening abdominal pain.  She does report of multiyear history of vague upper abdominal pain.  Over the last 6 months she has had weight loss as per her and her mother.  For the last 4 days the pain has been far more intense than usual.  She took Tums with no significant change in her pain today.  She has had some nausea and vomiting.  No fever or chills.  No apparent blood in her stool.  All the pain is concentrated in her upper abdomen.  She denies any pain in her lower abdomen.  She denies use of any tobacco, vaping, NSAIDs, Aleve/aspirin/Motrin/ibuprofen/Goody's; social EtOH use-no more than once or twice per week.  She denies any drug use.  She denies any history of having had any endoscopies in the past.  She does not currently take any acid reducing medications aside from as needed Tums.  She reports that she is capable of taking daily medications if this were necessary.  PSH: Denies ever having had abdominal or pelvic surgery  Past Medical History:  Diagnosis Date   Anemia     History reviewed. No pertinent surgical history.  History reviewed. No pertinent family history.  Social:  reports that she has never smoked. She has never used smokeless tobacco. She reports current alcohol use. She reports that she does not use drugs.  Allergies: No Known Allergies  Medications: I have reviewed the patient's current medications.  Results for orders placed or performed during the hospital encounter of 05/26/22 (from the past 48 hour(s))  CBC with Differential     Status: Abnormal   Collection Time: 05/26/22  4:10 PM  Result Value Ref Range   WBC 9.7 4.0 - 10.5 K/uL   RBC 4.31 3.87 - 5.11 MIL/uL   Hemoglobin 10.4 (L) 12.0 - 15.0 g/dL   HCT 32.7 (L) 36.0 - 46.0 %   MCV 75.9 (L) 80.0 - 100.0 fL   MCH 24.1  (L) 26.0 - 34.0 pg   MCHC 31.8 30.0 - 36.0 g/dL   RDW 18.9 (H) 11.5 - 15.5 %   Platelets 464 (H) 150 - 400 K/uL   nRBC 0.0 0.0 - 0.2 %   Neutrophils Relative % 82 %   Neutro Abs 7.9 (H) 1.7 - 7.7 K/uL   Lymphocytes Relative 13 %   Lymphs Abs 1.3 0.7 - 4.0 K/uL   Monocytes Relative 5 %   Monocytes Absolute 0.5 0.1 - 1.0 K/uL   Eosinophils Relative 0 %   Eosinophils Absolute 0.0 0.0 - 0.5 K/uL   Basophils Relative 0 %   Basophils Absolute 0.0 0.0 - 0.1 K/uL   Immature Granulocytes 0 %   Abs Immature Granulocytes 0.03 0.00 - 0.07 K/uL    Comment: Performed at [facility], [address], [place], [place] [address]  Comprehensive metabolic panel     Status: Abnormal   Collection Time: 05/26/22  4:10 PM  Result Value Ref Range   Sodium 136 135 - 145 mmol/L   Potassium 3.7 3.5 - 5.1 mmol/L   Chloride 100 98 - 111 mmol/L   CO2 24 22 - 32 mmol/L   Glucose, Bld 111 (H) 70 - 99 mg/dL    Comment: Glucose reference range applies only to samples taken after fasting for at least 8 hours.  BUN 5 (L) 6 - 20 mg/dL   Creatinine, Ser 0.79 0.44 - 1.00 mg/dL   Calcium 10.0 8.9 - 10.3 mg/dL   Total Protein 6.9 6.5 - 8.1 g/dL   Albumin 3.7 3.5 - 5.0 g/dL   AST 24 15 - 41 U/L   ALT 11 0 - 44 U/L   Alkaline Phosphatase 41 38 - 126 U/L   Total Bilirubin 0.5 0.3 - 1.2 mg/dL   GFR, Estimated >60 >60 mL/min    Comment: (NOTE) Calculated using the CKD-EPI Creatinine Equation (2021)    Anion gap 12 5 - 15    Comment: Performed at [facility] [address] [place], [place] [address]  Lipase, blood     Status: None   Collection Time: 05/26/22  4:10 PM  Result Value Ref Range   Lipase 35 11 - 51 U/L    Comment: Performed at [facility] [address], [place], [place] [address]  hCG, quantitative, pregnancy     Status: None   Collection Time: 05/26/22  4:10 PM  Result Value Ref Range   hCG, Beta Chain, Quant, S <1 <5 mIU/mL    Comment:          GEST. AGE      CONC.   (mIU/mL)   <=1 WEEK        5 - 50     2 WEEKS       50 - 500     3 WEEKS       100 - 10,000     4 WEEKS     1,000 - 30,000     5 WEEKS     3,500 - 115,000   6-8 WEEKS     12,000 - 270,000    12 WEEKS     15,000 - 220,000        FEMALE AND NON-PREGNANT FEMALE:     LESS THAN 5 mIU/mL Performed at [facility], [place] [address] [place], [place] [address]   Urinalysis, Routine w reflex microscopic Urine, Clean Catch     Status: Abnormal   Collection Time: 05/26/22  9:39 PM  Result Value Ref Range   Color, Urine YELLOW YELLOW   APPearance HAZY (A) CLEAR   Specific Gravity, Urine 1.034 (H) 1.005 - 1.030   pH 5.0 5.0 - 8.0   Glucose, UA NEGATIVE NEGATIVE mg/dL   Hgb urine dipstick NEGATIVE NEGATIVE   Bilirubin Urine NEGATIVE NEGATIVE   Ketones, ur 80 (A) NEGATIVE mg/dL   Protein, ur NEGATIVE NEGATIVE mg/dL   Nitrite NEGATIVE NEGATIVE   Leukocytes,Ua NEGATIVE NEGATIVE    Comment: Performed at [facility] [address] [place], [place] [address]    CT ABDOMEN PELVIS W CONTRAST  Result Date: 05/26/2022 CLINICAL DATA:  Acute abdominal pain for several days, initial encounter EXAM: CT ABDOMEN AND PELVIS WITH CONTRAST TECHNIQUE: Multidetector CT imaging of the abdomen and pelvis was performed using the standard protocol following bolus administration of intravenous contrast. RADIATION DOSE REDUCTION: This exam was performed according to the departmental dose-optimization program which includes automated exposure control, adjustment of the mA and/or kV according to patient size and/or use of iterative reconstruction technique. CONTRAST:  63mL OMNIPAQUE IOHEXOL 350 MG/ML SOLN COMPARISON:  None Available. FINDINGS: Lower chest: Lung bases are free of acute infiltrate or sizable effusion. Hepatobiliary: Gallbladder is decompressed. The liver is within normal limits. Mild perihepatic fluid is noted. Pancreas: Unremarkable. No pancreatic ductal dilatation  or surrounding inflammatory  changes. Spleen: Normal in size without focal abnormality. Adrenals/Urinary Tract: Adrenal glands are within normal limits. Kidneys demonstrate a normal enhancement pattern bilaterally. No renal calculi or obstructive changes are seen. The bladder is well distended. Stomach/Bowel: No obstructive or inflammatory changes of the colon are seen. Fecal material is noted scattered throughout colon. The appendix is within normal limits. Small bowel is unremarkable. The stomach demonstrates significant wall edema and there is a large deep crater ulcer identified in the midportion of the stomach. The ulcer extends to the gastric wall and there is evidence of free fluid and free air within the abdomen consistent with a perforated gastric ulcer. There also findings suggestive of a posterior wall ulcer at the same level. No other definitive ulcers are seen. Vascular/Lymphatic: No significant vascular findings are present. No enlarged abdominal or pelvic lymph nodes. Reproductive: Uterus is well visualized and demonstrates multiple lobular lesions consistent with uterine fibroids. The largest of these measures approximately 2.5 cm. No definitive adnexal mass is seen. Other: Considerable free fluid is noted within the pelvis as well as in the upper abdomen consistent with the perforated gastric ulcer. Mild free air is noted as well. Some peritoneal thickening is noted likely related to inflammatory change. Musculoskeletal: Bony structures are within normal limits. IMPRESSION: Considerable gastric edema is noted with evidence of mere image anterior and posterior wall gastric ulcers. The anterior gastric ulcer is perforated with a considerable amount of free air and the fluid within the abdomen. The posterior ulcer extends to the gastric wall although no perforation is appreciated. Uterine fibroid change. Critical Value/emergent results were called by telephone at the time of interpretation on 05/26/2022 at 9:42 pm to RILEY RANSOM,  PA , who verbally acknowledged these results. Electronically Signed   By: Alcide Clever M.D.   On: 05/26/2022 21:45    ROS - all of the below systems have been reviewed with the patient and positives are indicated with bold text General: chills, fever or night sweats Eyes: blurry vision or double vision ENT: epistaxis or sore throat Allergy/Immunology: itchy/watery eyes or nasal congestion Hematologic/Lymphatic: bleeding problems, blood clots or swollen lymph nodes Endocrine: temperature intolerance or unexpected weight changes Breast: new or changing breast lumps or nipple discharge Resp: cough, shortness of breath, or wheezing CV: chest pain or dyspnea on exertion GI: as per HPI GU: dysuria, trouble voiding, or hematuria MSK: joint pain or joint stiffness Neuro: TIA or stroke symptoms Derm: pruritus and skin lesion changes Psych: anxiety and depression  PE Blood pressure 122/80, pulse (!) 103, temperature 98.3 F (36.8 C), temperature source Oral, resp. rate 20, height 5\' 3"  (1.6 m), weight 56.7 kg, last menstrual period 05/09/2022, SpO2 100 %. Constitutional: NAD but uncomfortable; conversant; no deformities Eyes: Moist conjunctiva; no lid lag; anicteric; PERRL Neck: Trachea midline; no thyromegaly Lungs: Normal respiratory effort; no tactile fremitus CV: RRR; no palpable thrills; no pitting edema GI: Abdomen is diffusely tender with rebound and guarding; no palpable hepatosplenomegaly MSK: Normal range of motion of extremities; no clubbing/cyanosis Psychiatric: Appropriate affect; alert and oriented x3 Lymphatic: No palpable cervical or axillary lymphadenopathy  Results for orders placed or performed during the hospital encounter of 05/26/22 (from the past 48 hour(s))  CBC with Differential     Status: Abnormal   Collection Time: 05/26/22  4:10 PM  Result Value Ref Range   WBC 9.7 4.0 - 10.5 K/uL   RBC 4.31 3.87 - 5.11 MIL/uL   Hemoglobin 10.4 (L) 12.0 -  15.0 g/dL   HCT 32.7  (L) 36.0 - 46.0 %   MCV 75.9 (L) 80.0 - 100.0 fL   MCH 24.1 (L) 26.0 - 34.0 pg   MCHC 31.8 30.0 - 36.0 g/dL   RDW 18.9 (H) 11.5 - 15.5 %   Platelets 464 (H) 150 - 400 K/uL   nRBC 0.0 0.0 - 0.2 %   Neutrophils Relative % 82 %   Neutro Abs 7.9 (H) 1.7 - 7.7 K/uL   Lymphocytes Relative 13 %   Lymphs Abs 1.3 0.7 - 4.0 K/uL   Monocytes Relative 5 %   Monocytes Absolute 0.5 0.1 - 1.0 K/uL   Eosinophils Relative 0 %   Eosinophils Absolute 0.0 0.0 - 0.5 K/uL   Basophils Relative 0 %   Basophils Absolute 0.0 0.0 - 0.1 K/uL   Immature Granulocytes 0 %   Abs Immature Granulocytes 0.03 0.00 - 0.07 K/uL    Comment: Performed at [facility] [address] [place], [place] [address]  Comprehensive metabolic panel     Status: Abnormal   Collection Time: 05/26/22  4:10 PM  Result Value Ref Range   Sodium 136 135 - 145 mmol/L   Potassium 3.7 3.5 - 5.1 mmol/L   Chloride 100 98 - 111 mmol/L   CO2 24 22 - 32 mmol/L   Glucose, Bld 111 (H) 70 - 99 mg/dL    Comment: Glucose reference range applies only to samples taken after fasting for at least 8 hours.   BUN 5 (L) 6 - 20 mg/dL   Creatinine, Ser 0.79 0.44 - 1.00 mg/dL   Calcium 10.0 8.9 - 10.3 mg/dL   Total Protein 6.9 6.5 - 8.1 g/dL   Albumin 3.7 3.5 - 5.0 g/dL   AST 24 15 - 41 U/L   ALT 11 0 - 44 U/L   Alkaline Phosphatase 41 38 - 126 U/L   Total Bilirubin 0.5 0.3 - 1.2 mg/dL   GFR, Estimated >60 >60 mL/min    Comment: (NOTE) Calculated using the CKD-EPI Creatinine Equation (2021)    Anion gap 12 5 - 15    Comment: Performed at [facility] [address] [place], [place] [address]  Lipase, blood     Status: None   Collection Time: 05/26/22  4:10 PM  Result Value Ref Range   Lipase 35 11 - 51 U/L    Comment: Performed at [facility] [address] [place], [place] [address]  hCG, quantitative, pregnancy     Status: None   Collection Time: 05/26/22  4:10 PM  Result Value Ref Range   hCG, Beta Chain, Quant, S <1  <5 mIU/mL    Comment:          GEST. AGE      CONC.  (mIU/mL)   <=1 WEEK        5 - 50     2 WEEKS       50 - 500     3 WEEKS       100 - 10,000     4 WEEKS     1,000 - 30,000     5 WEEKS     3,500 - 115,000   6-8 WEEKS     12,000 - 270,000    12 WEEKS     15,000 - 220,000        FEMALE AND NON-PREGNANT FEMALE:     LESS THAN 5 mIU/mL Performed at [facility], [address], [place], [place] [address]   Urinalysis, Routine w reflex microscopic Urine, Clean Catch     Status: Abnormal   Collection Time: 05/26/22  9:39 PM  Result Value Ref Range   Color, Urine YELLOW YELLOW   APPearance HAZY (A) CLEAR   Specific Gravity, Urine 1.034 (H) 1.005 - 1.030   pH 5.0 5.0 - 8.0   Glucose, UA NEGATIVE NEGATIVE mg/dL   Hgb urine dipstick NEGATIVE NEGATIVE   Bilirubin Urine NEGATIVE NEGATIVE   Ketones, ur 80 (A) NEGATIVE mg/dL   Protein, ur NEGATIVE NEGATIVE mg/dL   Nitrite NEGATIVE NEGATIVE   Leukocytes,Ua NEGATIVE NEGATIVE    Comment: Performed at [facility], [address], [place], [place] [address]    CT ABDOMEN PELVIS W CONTRAST  Result Date: 05/26/2022 CLINICAL DATA:  Acute abdominal pain for several days, initial encounter EXAM: CT ABDOMEN AND PELVIS WITH CONTRAST TECHNIQUE: Multidetector CT imaging of the abdomen and pelvis was performed using the standard protocol following bolus administration of intravenous contrast. RADIATION DOSE REDUCTION: This exam was performed according to the departmental dose-optimization program which includes automated exposure control, adjustment of the mA and/or kV according to patient size and/or use of iterative reconstruction technique. CONTRAST:  39mL OMNIPAQUE IOHEXOL 350 MG/ML SOLN COMPARISON:  None Available. FINDINGS: Lower chest: Lung bases are free of acute infiltrate or sizable effusion. Hepatobiliary: Gallbladder is decompressed. The liver is within normal limits. Mild perihepatic fluid is noted. Pancreas: Unremarkable. No  pancreatic ductal dilatation or surrounding inflammatory changes. Spleen: Normal in size without focal abnormality. Adrenals/Urinary Tract: Adrenal glands are within normal limits. Kidneys demonstrate a normal enhancement pattern bilaterally. No renal calculi or obstructive changes are seen. The bladder is well distended. Stomach/Bowel: No obstructive or inflammatory changes of the colon are seen. Fecal material is noted scattered throughout colon. The appendix is within normal limits. Small bowel is unremarkable. The stomach demonstrates significant wall edema and there is a large deep crater ulcer identified in the midportion of the stomach. The ulcer extends to the gastric wall and there is evidence of free fluid and free air within the abdomen consistent with a perforated gastric ulcer. There also findings suggestive of a posterior wall ulcer at the same level. No other definitive ulcers are seen. Vascular/Lymphatic: No significant vascular findings are present. No enlarged abdominal or pelvic lymph nodes. Reproductive: Uterus is well visualized and demonstrates multiple lobular lesions consistent with uterine fibroids. The largest of these measures approximately 2.5 cm. No definitive adnexal mass is seen. Other: Considerable free fluid is noted within the pelvis as well as in the upper abdomen consistent with the perforated gastric ulcer. Mild free air is noted as well. Some peritoneal thickening is noted likely related to inflammatory change. Musculoskeletal: Bony structures are within normal limits. IMPRESSION: Considerable gastric edema is noted with evidence of mere image anterior and posterior wall gastric ulcers. The anterior gastric ulcer is perforated with a considerable amount of free air and the fluid within the abdomen. The posterior ulcer extends to the gastric wall although no perforation is appreciated. Uterine fibroid change. Critical Value/emergent results were called by telephone at the time of  interpretation on 05/26/2022 at 9:42 pm to RILEY RANSOM, PA , who verbally acknowledged these results. Electronically Signed   By: Alcide Clever M.D.   On: 05/26/2022 21:45     A/P: Madison Mejia is an 37 y.o. female with perforated viscus - suspected perforated peptic ulcer disease  -The anatomy  and physiology of the GI tract was discussed with her and her mother. The pathophysiology of hollow viscus perforation was discussed as well.  The CT scan does suggest that there is a high probability of this being a perforation in her stomach.  There is not appear to be any inflammation around her colon.  -We discussed surgery is the most realistic neck step in treatment here.  We reviewed exploratory laparotomy, repair of hollow viscus perforation-suspected gastric repair; scenarios or other surgeries could be necessary based upon intraoperative findings. -The planned procedure, material risks (including, but not limited to, pain, bleeding, infection, scarring, need for blood transfusion, damage to surrounding structures- blood vessels/nerves/viscus/organs, damage to ureter, leak from anastomosis, leak from repair, need for additional procedures, worsening of pre-existing medical conditions, hernia, recurrence, pneumonia, heart attack, stroke) benefits and alternatives to surgery were discussed at length. I noted a good probability that the procedure would help improve her symptoms. The patient's and her mother's questions were answered to their satisfaction, they voiced understanding and elected to proceed with surgery. Additionally, we discussed typical postoperative expectations and the recovery process.  High MDM - [place], [facility], [place]

## 2022-05-26 NOTE — ED Triage Notes (Signed)
Pt bib gcems c/o sharp upper abdominal pain for approx 4 days worsening in the past 30 mins. Endorses n/v. Endorses constipation - last bm a couple days ago. Denies chest pain or shob.   BP: 132/88, HR 90, Spo2 99%, CBG 120

## 2022-05-27 ENCOUNTER — Encounter (HOSPITAL_COMMUNITY): Payer: Self-pay | Admitting: Surgery

## 2022-05-27 ENCOUNTER — Other Ambulatory Visit: Payer: Self-pay

## 2022-05-27 DIAGNOSIS — K3189 Other diseases of stomach and duodenum: Principal | ICD-10-CM | POA: Diagnosis present

## 2022-05-27 DIAGNOSIS — Z4659 Encounter for fitting and adjustment of other gastrointestinal appliance and device: Secondary | ICD-10-CM | POA: Diagnosis not present

## 2022-05-27 DIAGNOSIS — B9681 Helicobacter pylori [H. pylori] as the cause of diseases classified elsewhere: Secondary | ICD-10-CM | POA: Diagnosis present

## 2022-05-27 DIAGNOSIS — D649 Anemia, unspecified: Secondary | ICD-10-CM | POA: Diagnosis present

## 2022-05-27 DIAGNOSIS — K5900 Constipation, unspecified: Secondary | ICD-10-CM | POA: Diagnosis present

## 2022-05-27 DIAGNOSIS — K255 Chronic or unspecified gastric ulcer with perforation: Secondary | ICD-10-CM | POA: Diagnosis present

## 2022-05-27 DIAGNOSIS — K251 Acute gastric ulcer with perforation: Secondary | ICD-10-CM | POA: Diagnosis not present

## 2022-05-27 LAB — BASIC METABOLIC PANEL
Anion gap: 9 (ref 5–15)
BUN: <5 mg/dL — ABNORMAL LOW (ref 6–20)
CO2: 25 mmol/L (ref 22–32)
Calcium: 8.1 mg/dL — ABNORMAL LOW (ref 8.9–10.3)
Chloride: 102 mmol/L (ref 98–111)
Creatinine, Ser: 0.68 mg/dL (ref 0.44–1.00)
GFR, Estimated: >60 mL/min (ref >60)
Glucose, Bld: 151 mg/dL — ABNORMAL HIGH (ref 70–99)
Potassium: 3.7 mmol/L (ref 3.5–5.1)
Sodium: 136 mmol/L (ref 135–145)

## 2022-05-27 LAB — CBC
HCT: 27.3 % — ABNORMAL LOW (ref 36.0–46.0)
Hemoglobin: 9.1 g/dL — ABNORMAL LOW (ref 12.0–15.0)
MCH: 24.7 pg — ABNORMAL LOW (ref 26.0–34.0)
MCHC: 33.3 g/dL (ref 30.0–36.0)
MCV: 74.2 fL — ABNORMAL LOW (ref 80.0–100.0)
Platelets: 411 10*3/uL — ABNORMAL HIGH (ref 150–400)
RBC: 3.68 MIL/uL — ABNORMAL LOW (ref 3.87–5.11)
RDW: 19 % — ABNORMAL HIGH (ref 11.5–15.5)
WBC: 17.1 10*3/uL — ABNORMAL HIGH (ref 4.0–10.5)
nRBC: 0 % (ref 0.0–0.2)

## 2022-05-27 LAB — MAGNESIUM: Magnesium: 1.3 mg/dL — ABNORMAL LOW (ref 1.7–2.4)

## 2022-05-27 LAB — HIV ANTIBODY (ROUTINE TESTING W REFLEX): HIV Screen 4th Generation wRfx: NONREACTIVE

## 2022-05-27 LAB — PHOSPHORUS: Phosphorus: 5.3 mg/dL — ABNORMAL HIGH (ref 2.5–4.6)

## 2022-05-27 MED ORDER — NALOXONE HCL 0.4 MG/ML IJ SOLN: 0.4000 mg | INTRAMUSCULAR | Status: DC | PRN

## 2022-05-27 MED ORDER — FLUCONAZOLE IN SODIUM CHLORIDE 400-0.9 MG/200ML-% IV SOLN
400.0000 mg | INTRAVENOUS | Status: AC
  Administered 2022-05-28 – 2022-06-01 (×5): 400 mg via INTRAVENOUS
  Filled 2022-05-27 (×5): qty 200

## 2022-05-27 MED ORDER — FLUCONAZOLE IN SODIUM CHLORIDE 400-0.9 MG/200ML-% IV SOLN
800.0000 mg | Freq: Once | INTRAVENOUS | Status: AC
  Administered 2022-05-27: 800 mg via INTRAVENOUS
  Filled 2022-05-27: qty 400

## 2022-05-27 MED ORDER — SODIUM CHLORIDE 0.9% FLUSH: 9.0000 mL | INTRAVENOUS | Status: DC | PRN

## 2022-05-27 MED ORDER — AMISULPRIDE (ANTIEMETIC) 5 MG/2ML IV SOLN: 10.0000 mg | Freq: Once | INTRAVENOUS | Status: DC | PRN

## 2022-05-27 MED ORDER — ENOXAPARIN SODIUM 40 MG/0.4ML IJ SOSY
40.0000 mg | PREFILLED_SYRINGE | INTRAMUSCULAR | Status: DC
  Administered 2022-05-27 – 2022-05-28 (×2): 40 mg via SUBCUTANEOUS
  Filled 2022-05-27 (×3): qty 0.4

## 2022-05-27 MED ORDER — MAGNESIUM SULFATE 2 GM/50ML IV SOLN
2.0000 g | Freq: Once | INTRAVENOUS | Status: AC
  Administered 2022-05-27: 2 g via INTRAVENOUS
  Filled 2022-05-27: qty 50

## 2022-05-27 MED ORDER — DIPHENHYDRAMINE HCL 12.5 MG/5ML PO ELIX: 12.5000 mg | ORAL_SOLUTION | Freq: Four times a day (QID) | ORAL | Status: DC | PRN

## 2022-05-27 MED ORDER — ONDANSETRON 4 MG PO TBDP: 4.0000 mg | ORAL_TABLET | Freq: Four times a day (QID) | ORAL | Status: DC | PRN

## 2022-05-27 MED ORDER — METOPROLOL TARTRATE 5 MG/5ML IV SOLN: 5.0000 mg | Freq: Four times a day (QID) | INTRAVENOUS | Status: DC | PRN

## 2022-05-27 MED ORDER — DEXAMETHASONE SODIUM PHOSPHATE 10 MG/ML IJ SOLN
INTRAMUSCULAR | Status: DC | PRN
  Administered 2022-05-26: 10 mg via INTRAVENOUS

## 2022-05-27 MED ORDER — BISACODYL 10 MG RE SUPP
10.0000 mg | Freq: Once | RECTAL | Status: DC
  Filled 2022-05-27: qty 1

## 2022-05-27 MED ORDER — ONDANSETRON HCL 4 MG/2ML IJ SOLN: 4.0000 mg | Freq: Four times a day (QID) | INTRAMUSCULAR | Status: DC | PRN

## 2022-05-27 MED ORDER — FENTANYL CITRATE (PF) 100 MCG/2ML IJ SOLN
25.0000 ug | INTRAMUSCULAR | Status: DC | PRN
  Administered 2022-05-27: 50 ug via INTRAVENOUS
  Administered 2022-05-27: 25 ug via INTRAVENOUS

## 2022-05-27 MED ORDER — PIPERACILLIN-TAZOBACTAM 3.375 G IVPB
3.3750 g | Freq: Three times a day (TID) | INTRAVENOUS | Status: AC
  Administered 2022-05-27 – 2022-06-01 (×15): 3.375 g via INTRAVENOUS
  Filled 2022-05-27 (×14): qty 50

## 2022-05-27 MED ORDER — ACETAMINOPHEN 10 MG/ML IV SOLN: 1000.0000 mg | Freq: Once | INTRAVENOUS | Status: DC | PRN

## 2022-05-27 MED ORDER — LACTATED RINGERS IV SOLN: INTRAVENOUS | Status: DC

## 2022-05-27 MED ORDER — HYDROMORPHONE 1 MG/ML IV SOLN
INTRAVENOUS | Status: DC
  Filled 2022-05-27 (×5): qty 30

## 2022-05-27 MED ORDER — ONDANSETRON HCL 4 MG/2ML IJ SOLN
INTRAMUSCULAR | Status: DC | PRN
  Administered 2022-05-27: 4 mg via INTRAVENOUS

## 2022-05-27 MED ORDER — DIPHENHYDRAMINE HCL 50 MG/ML IJ SOLN: 12.5000 mg | Freq: Four times a day (QID) | INTRAMUSCULAR | Status: DC | PRN

## 2022-05-27 MED ORDER — SUGAMMADEX SODIUM 200 MG/2ML IV SOLN
INTRAVENOUS | Status: DC | PRN
  Administered 2022-05-27: 200 mg via INTRAVENOUS

## 2022-05-27 MED ORDER — FENTANYL CITRATE (PF) 100 MCG/2ML IJ SOLN
INTRAMUSCULAR | Status: AC
  Administered 2022-05-27: 25 ug via INTRAVENOUS
  Filled 2022-05-27: qty 2

## 2022-05-27 MED ORDER — ACETAMINOPHEN 10 MG/ML IV SOLN
1000.0000 mg | Freq: Four times a day (QID) | INTRAVENOUS | Status: AC
  Administered 2022-05-27 – 2022-05-28 (×3): 1000 mg via INTRAVENOUS
  Filled 2022-05-27 (×5): qty 100

## 2022-05-27 MED ORDER — PANTOPRAZOLE SODIUM 40 MG IV SOLR
40.0000 mg | Freq: Two times a day (BID) | INTRAVENOUS | Status: DC
  Administered 2022-05-27 – 2022-05-31 (×11): 40 mg via INTRAVENOUS
  Filled 2022-05-27 (×11): qty 10

## 2022-05-27 NOTE — Anesthesia Postprocedure Evaluation (Signed)
Anesthesia Post Note  Patient: Madison Mejia  Procedure(s) Performed: EXPLORATORY LAPAROTOMY [facility] REPAIR     Patient location during evaluation: PACU Anesthesia Type: General Level of consciousness: awake and alert Pain management: pain level controlled Vital Signs Assessment: post-procedure vital signs reviewed and stable Respiratory status: spontaneous breathing, nonlabored ventilation, respiratory function stable and patient connected to nasal cannula oxygen Cardiovascular status: blood pressure returned to baseline and stable Postop Assessment: no apparent nausea or vomiting Anesthetic complications: no   No notable events documented.  Last Vitals:  Vitals:   05/27/22 0149 05/27/22 0405  BP: 130/82   Pulse: 100   Resp: 16 18  Temp:    SpO2: 99% 100%    Last Pain:  Vitals:   05/27/22 0405  TempSrc:   PainSc: 7                  [name] [name]

## 2022-05-27 NOTE — TOC Initial Note (Signed)
Transition of Care [facility]) - Initial/Assessment Note    Patient Details  Name: Madison Mejia MRN: [id number] Date of Birth: 10/13/1985  Transition of [facility]) CM/SW Contact:    Levonne Lapping, RN Phone Number: 05/27/2022, 3:55 PM  Clinical Narrative:         Transition of Care [facility]) Screening Note   Patient Details  Name: Madison Mejia Date of Birth: 1985-05-16   Transition of Care [facility]) CM/SW Contact:    Levonne Lapping, RN Phone Number: 05/27/2022, 3:56 PM    Transition of Care Department [facility]) has reviewed patient and no TOC needs have been identified at this time. We will continue to monitor patient advancement through interdisciplinary progression rounds. If new patient transition needs arise, please place a TOC consult.               Expected Discharge Plan: Home/Self Care Barriers to Discharge: Continued Medical Work up   Patient Goals and CMS Choice Patient states their goals for this hospitalization and ongoing recovery are:: Get better and go home   Choice offered to / list presented to : NA      Expected Discharge Plan and Services In-house Referral: NA Discharge Planning Services: NA Post Acute Care Choice: NA Living arrangements for the past 2 months: Apartment                 DME Arranged: N/A DME Agency: NA                  Prior Living Arrangements/Services Living arrangements for the past 2 months: Apartment Lives with:: Self   Do you feel safe going back to the place where you live?: Yes      Need for Family Participation in Patient Care: No (Comment) Care giver support system in place?: Yes (comment) (Parents) Current home services:  (NONE) Criminal Activity/Legal Involvement Pertinent to Current Situation/Hospitalization: No - Comment as needed  Activities of Daily Living Home Assistive Devices/Equipment: None ADL Screening (condition at time of admission) Patient's cognitive ability adequate to safely complete daily  activities?: Yes Is the patient deaf or have difficulty hearing?: No Does the patient have difficulty seeing, even when wearing glasses/contacts?: No Does the patient have difficulty concentrating, remembering, or making decisions?: No Patient able to express need for assistance with ADLs?: Yes Does the patient have difficulty dressing or bathing?: Yes Independently performs ADLs?: No Communication: Independent Does the patient have difficulty walking or climbing stairs?: No Weakness of Legs: Both Weakness of Arms/Hands: None  Permission Sought/Granted                  Emotional Assessment Appearance:: Appears younger than stated age Attitude/Demeanor/Rapport: Engaged Affect (typically observed): Accepting Orientation: : Oriented to Self, Oriented to Place, Oriented to  Time, Oriented to Situation Alcohol / Substance Use: Not Applicable Psych Involvement: No (comment)  Admission diagnosis:  Gastric perforation, acute [K31.89] Patient Active Problem List   Diagnosis Date Noted   Gastric perforation, acute 05/27/2022   Dysmenorrhea 12/15/2020   PCP:  Cletis Athens, MD Pharmacy:   [facility], [place] [address] [place] [address] Phone: [phone] Fax: [phone]     Social Determinants of Health (SDOH) Social History: SDOH Screenings   Depression (PHQ2-9): Low Risk  (07/13/2021)  Tobacco Use: Low Risk  (05/27/2022)   SDOH Interventions:     Readmission Risk Interventions  No data to display

## 2022-05-27 NOTE — Anesthesia Procedure Notes (Signed)
Procedure Name: Intubation Date/Time: 05/26/2022 11:28 PM  Performed by: Valetta Fuller, CRNAPre-anesthesia Checklist: Patient identified, Emergency Drugs available, Suction available and Patient being monitored Patient Re-evaluated:Patient Re-evaluated prior to induction Oxygen Delivery Method: Circle system utilized Preoxygenation: Pre-oxygenation with 100% oxygen Induction Type: IV induction and Rapid sequence Laryngoscope Size: Miller and 2 Grade View: Grade I Tube size: 7.0 mm Number of attempts: 1 Airway Equipment and Method: Stylet Placement Confirmation: ETT inserted through vocal cords under direct vision, positive ETCO2 and breath sounds checked- equal and bilateral Secured at: 21 cm Tube secured with: Tape Dental Injury: Teeth and Oropharynx as per pre-operative assessment

## 2022-05-27 NOTE — Op Note (Signed)
05/26/2022 - 05/27/2022  12:52 AM  PATIENT:  Madison Mejia  37 y.o. female  Patient Care Team: Cletis Athens, MD as PCP - General (Internal Medicine)  PRE-OPERATIVE DIAGNOSIS:  Perforated gastric ulcer  POST-OPERATIVE DIAGNOSIS:  Perforated gastric body ulcer  PROCEDURE:  Exploratory laparotomy with Phillip Heal patch repair of perforated gastric ulcer  SURGEON:  Sharon Mt. Atonya Templer, MD  ASSISTANT: OR staff  ANESTHESIA:   general  COUNTS:  Sponge, needle and instrument counts were reported correct x2 at the conclusion of the operation.  EBL: 50 mL  DRAINS: 19 Fr round blake drain left draining repair  SPECIMEN: None  COMPLICATIONS: None  FINDINGS: Significant constipation throughout transverse colon with hard pellet stool. Perforation of the anterior gastric body along the lesser curvature.  Clean-based ulcer linear perforation that is 1 cm in length and 2 to 3 mm wide.  She also has thickening of the posterior wall of her stomach consistent with peptic ulcer disease in this location is adherent to her pancreas.  There is no evident perforation at this location.  Graham patch repair carried out for the anterior perforation using pedicled omentum.  NG confirmed to be within the body of the stomach at conclusion.  A 19 French round Blake drain was also left draining the repair extraluminally.  DISPOSITION: PACU in satisfactory condition  DESCRIPTION: The patient was identified in preop holding and taken to the OR where she was placed on the operating room table. SCDs were placed. General endotracheal anesthesia was induced without difficulty.  All pressure points were then padded and she was secured to the operating table.  A Foley catheter was placed by nursing.  She was then prepped and draped in the usual sterile fashion. A surgical timeout was performed indicating the correct patient, procedure, positioning and need for preoperative antibiotics.   An upper midline incision was  made and the subcutaneous tissue was then divided electrocautery.  The fascia was incised in the midline.  The peritoneum was carefully entered bluntly.  Upon entering the abdomen, 600 cc of turbid gastric appearing contents are evacuated.  Upon entering the abdomen (organ space), I encountered a phlegmon involving the anterior gastric wall with perforation .  CASE DATA:  Type of patient?: DOW CASE (Surgical Hospitalist [facility])  Status of Case? EMERGENT Add On  Infection Present At Time Of Surgery (PATOS)?  PHLEGMON involving the anterior gastric wall  It is immediately apparent that there is a linear perforation on the gastric body just beneath the lesser curvature that is approximately 1 cm in length and a couple millimeters wide.  Gastric contents are clearly exuding from this perforation.  The rest the abdomen is inspected.  There is hard formed pellet-like stool within much of her transverse colon.  The surfaces of the liver are normal in appearance.  The surface of the small bowel is normal in appearance.  Attention is directed at the stomach.  The perforation appears to be at the location where she has significant induration of the gastric wall which is most likely consistent with peptic ulcer disease.  The omentum was lifted anteriorly.  The transverse colon was retracted caudad.  The lesser sac is opened taking care to preserve the omentum for a subsequent Engineer, structural.  The posterior wall of the stomach is inspected.  There is a 1 x 1 cm area that is quite adherent to her pancreas with some induration of the gastric wall consistent with peptic ulcer disease but there is no  evident perforation at this location.  Attention was then redirected at the anterior perforation.  The omentum was evaluated and a tongue of omentum was developed using the LigaSure incorporating a well-vascularized pedicle with fat with a vessel running off the stomach directly.  2-0 silk sutures were then used  taking healthy bites of tissue on either side of the perforation and after setting this up, sliding the tongue of omentum underneath the sutures.  The sutures were then carefully tied snug but without causing ischemia and in doing so both patching and closing the perforation.  The repair was then inspected and noted to be adequate without any evident leakage of gastric contents on gentle palpation of the gastric wall.  Additional omentum was then brought up onto the anterior wall of the stomach and placed on top of our patch repair.  This was secured to the stomach using 2-0 silk suture.  An NG tube had been placed by anesthesia and we are able to confirm that this sits well within the stomach and is appropriately decompressing the stomach.  A 19 French round Blake drain is then placed through a stab incision in the left upper quadrant.  This drain is then directed to drain the gastrohepatic space and running just cephalad to our repair.  This is secured to the skin using a 2-0 nylon suture.  The abdomen is irrigated with copious amounts of warm saline until the effluent runs clear.  The midline fascia was then closed using 2 running #1 PDS sutures, tied centrally.  All sponge, needle, and instrument counts were reported correct x 2.  The JP is then placed to bulb suction.  The subcutaneous tissue was irrigated.  A moist Kerlix is then placed in the wound bed and a wet-to-dry dressing manner.  This wound was intentionally left open given the significant contamination throughout this procedure.  Additional dressings consisting of 4 x 4's, ABD, and tape was placed.  A drain dressing is also created in place.  She is then awakened from anesthesia, extubated, and transferred to a stretcher for transport to PACU in satisfactory condition.

## 2022-05-27 NOTE — Progress Notes (Signed)
1 Day Post-Op   Subjective/Chief Complaint: Comfortable Pain well controlled   Objective: Vital signs in last 24 hours: Temp:  [97 F (36.1 C)-99.6 F (37.6 C)] 97.7 F (36.5 C) (01/19 0400) Pulse Rate:  [97-116] 100 (01/19 0826) Resp:  [12-34] 20 (01/19 0826) BP: (94-139)/(71-90) 102/71 (01/19 0826) SpO2:  [95 %-100 %] 100 % (01/19 0826) FiO2 (%):  [28 %] 28 % (01/19 0803) Weight:  [56.7 kg] 56.7 kg (01/18 1554)    Intake/Output from previous day: 01/18 0701 - 01/19 0700 In: 2800 [I.V.:2800] Out: 665 [Urine:575; Drains:40; Blood:50] Intake/Output this shift: No intake/output data recorded.  Exam: Awake and alert NG in place Abdomen soft, open wound clean Drain serosang  Lab Results:  Recent Labs    05/26/22 1610 05/27/22 0530  WBC 9.7 17.1*  HGB 10.4* 9.1*  HCT 32.7* 27.3*  PLT 464* 411*   BMET Recent Labs    05/26/22 1610 05/27/22 0530  NA 136 136  K 3.7 3.7  CL 100 102  CO2 24 25  GLUCOSE 111* 151*  BUN 5* <5*  CREATININE 0.79 0.68  CALCIUM 10.0 8.1*   PT/INR No results for input(s): "LABPROT", "INR" in the last 72 hours. ABG No results for input(s): "PHART", "HCO3" in the last 72 hours.  Invalid input(s): "PCO2", "PO2"  Studies/Results: CT ABDOMEN PELVIS W CONTRAST  Result Date: 05/26/2022 CLINICAL DATA:  Acute abdominal pain for several days, initial encounter EXAM: CT ABDOMEN AND PELVIS WITH CONTRAST TECHNIQUE: Multidetector CT imaging of the abdomen and pelvis was performed using the standard protocol following bolus administration of intravenous contrast. RADIATION DOSE REDUCTION: This exam was performed according to the departmental dose-optimization program which includes automated exposure control, adjustment of the mA and/or kV according to patient size and/or use of iterative reconstruction technique. CONTRAST:  7mL OMNIPAQUE IOHEXOL 350 MG/ML SOLN COMPARISON:  None Available. FINDINGS: Lower chest: Lung bases are free of acute  infiltrate or sizable effusion. Hepatobiliary: Gallbladder is decompressed. The liver is within normal limits. Mild perihepatic fluid is noted. Pancreas: Unremarkable. No pancreatic ductal dilatation or surrounding inflammatory changes. Spleen: Normal in size without focal abnormality. Adrenals/Urinary Tract: Adrenal glands are within normal limits. Kidneys demonstrate a normal enhancement pattern bilaterally. No renal calculi or obstructive changes are seen. The bladder is well distended. Stomach/Bowel: No obstructive or inflammatory changes of the colon are seen. Fecal material is noted scattered throughout colon. The appendix is within normal limits. Small bowel is unremarkable. The stomach demonstrates significant wall edema and there is a large deep crater ulcer identified in the midportion of the stomach. The ulcer extends to the gastric wall and there is evidence of free fluid and free air within the abdomen consistent with a perforated gastric ulcer. There also findings suggestive of a posterior wall ulcer at the same level. No other definitive ulcers are seen. Vascular/Lymphatic: No significant vascular findings are present. No enlarged abdominal or pelvic lymph nodes. Reproductive: Uterus is well visualized and demonstrates multiple lobular lesions consistent with uterine fibroids. The largest of these measures approximately 2.5 cm. No definitive adnexal mass is seen. Other: Considerable free fluid is noted within the pelvis as well as in the upper abdomen consistent with the perforated gastric ulcer. Mild free air is noted as well. Some peritoneal thickening is noted likely related to inflammatory change. Musculoskeletal: Bony structures are within normal limits. IMPRESSION: Considerable gastric edema is noted with evidence of mere image anterior and posterior wall gastric ulcers. The anterior gastric ulcer is perforated  with a considerable amount of free air and the fluid within the abdomen. The  posterior ulcer extends to the gastric wall although no perforation is appreciated. Uterine fibroid change. Critical Value/emergent results were called by telephone at the time of interpretation on 05/26/2022 at 9:42 pm to [place], [place] , who verbally acknowledged these results. Electronically Signed   By: Inez Catalina M.D.   On: 05/26/2022 21:45    Anti-infectives: Anti-infectives (From admission, onward)    Start     Dose/Rate Route Frequency Ordered Stop   05/28/22 0000  fluconazole (DIFLUCAN) IVPB 400 mg        400 mg 100 mL/hr over 120 Minutes Intravenous Every 24 hours 05/27/22 0318     05/27/22 0415  piperacillin-tazobactam (ZOSYN) IVPB 3.375 g        3.375 g 12.5 mL/hr over 240 Minutes Intravenous Every 8 hours 05/27/22 0318     05/27/22 0130  fluconazole (DIFLUCAN) IVPB 800 mg        800 mg 100 mL/hr over 240 Minutes Intravenous  Once 05/27/22 0118 05/27/22 0626   05/26/22 2200  piperacillin-tazobactam (ZOSYN) IVPB 3.375 g        3.375 g 100 mL/hr over 30 Minutes Intravenous  Once 05/26/22 2152 05/27/22 0810       Assessment/Plan: POD#1 S/p exp lap and Graham patch repair of perforated gastric ulcer by CW on 1/18. Also has ulcer on posterior wall of the stomach adherent to the pancreas  -continue NPO and NG, IV protonix -continue antibiotics -wound care -check H.Pylori -likely will need UGI contrast study between POD 3 and 5 -d/c foley  LOS: 0 days    Madison Mejia 05/27/2022

## 2022-05-27 NOTE — Transfer of Care (Signed)
Immediate Anesthesia Transfer of Care Note  Patient: Madison Mejia  Procedure(s) Performed: [place]  Patient Location: PACU  Anesthesia Type:General  Level of Consciousness: oriented and sedated  Airway & Oxygen Therapy: Patient Spontanous Breathing  Post-op Assessment: Report given to RN and Post -op Vital signs reviewed and stable  Post vital signs: Reviewed and stable  Last Vitals:  Vitals Value Taken Time  BP 126/72 05/27/22 0100  Temp    Pulse 114 05/27/22 0100  Resp 23 05/27/22 0104  SpO2 95 % 05/27/22 0100  Vitals shown include unvalidated device data.  Last Pain:  Vitals:   05/26/22 2227  TempSrc:   PainSc: 10-Worst pain ever         Complications: No notable events documented.

## 2022-05-27 NOTE — Progress Notes (Signed)
Pharmacy Antibiotic Note  Chris Narasimhan is a 37 y.o. female admitted on 05/26/2022 with  phlegmon involving the anterior gastric wall with perforation, now s/p surgical intervention .  Pharmacy has been consulted for Zosyn and fluconazole dosing.  Plan: Zosyn 3.375g IV Q8H (4-hour infusion). Fluconazole 800mg  IV x1 followed by 400mg  IV Q24H.  Height: 5\' 3"  (160 cm) Weight: 56.7 kg (125 lb) IBW/kg (Calculated) : 52.4  Temp (24hrs), Avg:98.5 F (36.9 C), Min:97 F (36.1 C), Max:99.6 F (37.6 C)  Recent Labs  Lab 05/26/22 1610  WBC 9.7  CREATININE 0.79    Estimated Creatinine Clearance: 82 mL/min (by C-G formula based on SCr of 0.79 mg/dL).    Allergies  Allergen Reactions   Nsaids Anaphylaxis    Thank you for allowing pharmacy to be a part of this patient's care.  Wynona Neat, PharmD, BCPS  05/27/2022 3:13 AM

## 2022-05-28 LAB — CBC
HCT: 21.9 % — ABNORMAL LOW (ref 36.0–46.0)
Hemoglobin: 7.2 g/dL — ABNORMAL LOW (ref 12.0–15.0)
MCH: 24.2 pg — ABNORMAL LOW (ref 26.0–34.0)
MCHC: 32.9 g/dL (ref 30.0–36.0)
MCV: 73.7 fL — ABNORMAL LOW (ref 80.0–100.0)
Platelets: 349 10*3/uL (ref 150–400)
RBC: 2.97 MIL/uL — ABNORMAL LOW (ref 3.87–5.11)
RDW: 19.6 % — ABNORMAL HIGH (ref 11.5–15.5)
WBC: 17.3 10*3/uL — ABNORMAL HIGH (ref 4.0–10.5)
nRBC: 0 % (ref 0.0–0.2)

## 2022-05-28 LAB — BASIC METABOLIC PANEL
Anion gap: 8 (ref 5–15)
BUN: 5 mg/dL — ABNORMAL LOW (ref 6–20)
CO2: 25 mmol/L (ref 22–32)
Calcium: 7.9 mg/dL — ABNORMAL LOW (ref 8.9–10.3)
Chloride: 107 mmol/L (ref 98–111)
Creatinine, Ser: 0.85 mg/dL (ref 0.44–1.00)
GFR, Estimated: >60 mL/min (ref >60)
Glucose, Bld: 96 mg/dL (ref 70–99)
Potassium: 3.5 mmol/L (ref 3.5–5.1)
Sodium: 140 mmol/L (ref 135–145)

## 2022-05-28 LAB — MAGNESIUM: Magnesium: 2.1 mg/dL (ref 1.7–2.4)

## 2022-05-28 MED ORDER — BISACODYL 10 MG RE SUPP
10.0000 mg | Freq: Once | RECTAL | Status: AC
  Administered 2022-05-28: 10 mg via RECTAL
  Filled 2022-05-28: qty 1

## 2022-05-28 MED ORDER — CHLORHEXIDINE GLUCONATE CLOTH 2 % EX PADS
6.0000 | MEDICATED_PAD | Freq: Every day | CUTANEOUS | Status: DC
  Administered 2022-05-28 – 2022-05-30 (×3): 6 via TOPICAL

## 2022-05-28 NOTE — Progress Notes (Signed)
31 Went in pt room to remove foley. Pt requesting for RN to wait to remove foley until mother arrives so that mother is able to help her ambulate. RN educated pt that nursing staff is available to help with ambulation and pt could also use BSC. Pt desires to wait for mother instead. Instructed pt to use call light when mother arrives so foley can be removed per order. Verbalizes understanding. Call light within reach. Father at bedside and agrees to plan.

## 2022-05-28 NOTE — Progress Notes (Signed)
Assessment & Plan: POD#1 - status post ex lap with Graham patch perforated gastric ulcer - Dr. Dema Severin 05/27/2022  NPO, NG, IVF  IV Protonix  Plan UGI on Monday  Hgb 7.2 this AM - not currently symptomatic - discussed possible transfusion  Repeat Hgb in AM 1/21        Armandina Gemma, MD [facility] [place] practice Office: [phone]        Chief Complaint: Perforated ulcer  Subjective: Patient in bed, working on computer, family at bedside.  No complaints, pain controlled.  Objective: Vital signs in last 24 hours: Temp:  [98 F (36.7 C)-98.4 F (36.9 C)] 98.4 F (36.9 C) (01/20 0500) Pulse Rate:  [77-98] 90 (01/20 0900) Resp:  [7-22] 19 (01/20 0828) BP: (103-114)/(69-76) 114/76 (01/20 0900) SpO2:  [98 %-100 %] 100 % (01/20 0828) FiO2 (%):  [28 %] 28 % (01/20 0828)    Intake/Output from previous day: 01/19 0701 - 01/20 0700 In: 3647.3 [I.V.:2942.5; NG/GT:5; IV Piggyback:699.8] Out: 1280 [Urine:1175; Drains:105] Intake/Output this shift: No intake/output data recorded.  Physical Exam: HEENT - sclerae clear, mucous membranes moist Abdomen - soft, JP with serosanguinous; NG bilious Ext - no edema, non-tender Neuro - alert & oriented, no focal deficits  Lab Results:  Recent Labs    05/27/22 0530 05/28/22 0423  WBC 17.1* 17.3*  HGB 9.1* 7.2*  HCT 27.3* 21.9*  PLT 411* 349   BMET Recent Labs    05/27/22 0530 05/28/22 0423  NA 136 140  K 3.7 3.5  CL 102 107  CO2 25 25  GLUCOSE 151* 96  BUN <5* 5*  CREATININE 0.68 0.85  CALCIUM 8.1* 7.9*   PT/INR No results for input(s): "LABPROT", "INR" in the last 72 hours. Comprehensive Metabolic Panel:    Component Value Date/Time   NA 140 05/28/2022 0423   NA 136 05/27/2022 0530   K 3.5 05/28/2022 0423   K 3.7 05/27/2022 0530   CL 107 05/28/2022 0423   CL 102 05/27/2022 0530   CO2 25 05/28/2022 0423   CO2 25 05/27/2022 0530   BUN 5 (L) 05/28/2022 0423   BUN <5 (L) 05/27/2022 0530    CREATININE 0.85 05/28/2022 0423   CREATININE 0.68 05/27/2022 0530   GLUCOSE 96 05/28/2022 0423   GLUCOSE 151 (H) 05/27/2022 0530   CALCIUM 7.9 (L) 05/28/2022 0423   CALCIUM 8.1 (L) 05/27/2022 0530   AST 24 05/26/2022 1610   AST 14 (L) 11/27/2020 1225   ALT 11 05/26/2022 1610   ALT 14 11/27/2020 1225   ALKPHOS 41 05/26/2022 1610   ALKPHOS 45 11/27/2020 1225   BILITOT 0.5 05/26/2022 1610   BILITOT 0.6 11/27/2020 1225   PROT 6.9 05/26/2022 1610   PROT 6.6 11/27/2020 1225   ALBUMIN 3.7 05/26/2022 1610   ALBUMIN 3.7 11/27/2020 1225    Studies/Results: CT ABDOMEN PELVIS W CONTRAST  Result Date: 05/26/2022 CLINICAL DATA:  Acute abdominal pain for several days, initial encounter EXAM: CT ABDOMEN AND PELVIS WITH CONTRAST TECHNIQUE: Multidetector CT imaging of the abdomen and pelvis was performed using the standard protocol following bolus administration of intravenous contrast. RADIATION DOSE REDUCTION: This exam was performed according to the departmental dose-optimization program which includes automated exposure control, adjustment of the mA and/or kV according to patient size and/or use of iterative reconstruction technique. CONTRAST:  5mL OMNIPAQUE IOHEXOL 350 MG/ML SOLN COMPARISON:  None Available. FINDINGS: Lower chest: Lung bases are free of acute infiltrate or sizable effusion. Hepatobiliary: Gallbladder  is decompressed. The liver is within normal limits. Mild perihepatic fluid is noted. Pancreas: Unremarkable. No pancreatic ductal dilatation or surrounding inflammatory changes. Spleen: Normal in size without focal abnormality. Adrenals/Urinary Tract: Adrenal glands are within normal limits. Kidneys demonstrate a normal enhancement pattern bilaterally. No renal calculi or obstructive changes are seen. The bladder is well distended. Stomach/Bowel: No obstructive or inflammatory changes of the colon are seen. Fecal material is noted scattered throughout colon. The appendix is within normal  limits. Small bowel is unremarkable. The stomach demonstrates significant wall edema and there is a large deep crater ulcer identified in the midportion of the stomach. The ulcer extends to the gastric wall and there is evidence of free fluid and free air within the abdomen consistent with a perforated gastric ulcer. There also findings suggestive of a posterior wall ulcer at the same level. No other definitive ulcers are seen. Vascular/Lymphatic: No significant vascular findings are present. No enlarged abdominal or pelvic lymph nodes. Reproductive: Uterus is well visualized and demonstrates multiple lobular lesions consistent with uterine fibroids. The largest of these measures approximately 2.5 cm. No definitive adnexal mass is seen. Other: Considerable free fluid is noted within the pelvis as well as in the upper abdomen consistent with the perforated gastric ulcer. Mild free air is noted as well. Some peritoneal thickening is noted likely related to inflammatory change. Musculoskeletal: Bony structures are within normal limits. IMPRESSION: Considerable gastric edema is noted with evidence of mere image anterior and posterior wall gastric ulcers. The anterior gastric ulcer is perforated with a considerable amount of free air and the fluid within the abdomen. The posterior ulcer extends to the gastric wall although no perforation is appreciated. Uterine fibroid change. Critical Value/emergent results were called by telephone at the time of interpretation on 05/26/2022 at 9:42 pm to [place], PA , who verbally acknowledged these results. Electronically Signed   By: Inez Catalina M.D.   On: 05/26/2022 21:45      Armandina Gemma 05/28/2022  Patient ID: Madison Mejia, female   DOB: 1986/05/02, 37 y.o.   MRN: [id number]

## 2022-05-29 LAB — HEMOGLOBIN AND HEMATOCRIT, BLOOD
HCT: 22.5 % — ABNORMAL LOW (ref 36.0–46.0)
Hemoglobin: 6.9 g/dL — CL (ref 12.0–15.0)

## 2022-05-29 MED ORDER — MENTHOL 3 MG MT LOZG
1.0000 | LOZENGE | OROMUCOSAL | Status: DC | PRN
  Filled 2022-05-29: qty 9

## 2022-05-29 NOTE — Progress Notes (Signed)
Date and time results received: 05/29/22 04:03 (use smartphrase ".now" to insert current time)  Test: HGB  Critical Value: 6.9  Name of Provider Notified: [facility] left message with answering service at 04:14  Orders Received? Or Actions Taken?: Received call back from [place], RN stated surgeon is aware of results but is currently in a critical case and will call back later. Assured nurse patient is stable and HGB was 7.2 yesterday.

## 2022-05-29 NOTE — Progress Notes (Addendum)
Assessment & Plan: POD#2 - status post ex lap with Phillip Heal patch perforated gastric ulcer - Dr. Dema Severin 05/27/2022             NPO, NG, IVF             IV Protonix             Plan UGI on Monday             Hgb 6.9 this AM - not currently symptomatic - discussed possible transfusion, but wishes to hold off for now; no sign of active bleeding             Repeat Hgb in AM 1/22        Armandina Gemma, MD [facility] A [place] practice Office: [phone]        Chief Complaint: Perforated gastric ulcer  Subjective: Patient in bed, family at bedside. Foley out.  Comfortable.  Denies dizziness, syncope, weakness.  Passing flatus, no BM's.  Objective: Vital signs in last 24 hours: Temp:  [98.8 F (37.1 C)-98.9 F (37.2 C)] 98.8 F (37.1 C) (01/21 0306) Pulse Rate:  [74-101] 86 (01/21 0600) Resp:  [11-24] 18 (01/21 0756) BP: (100-125)/(68-84) 104/73 (01/21 0400) SpO2:  [94 %-100 %] 100 % (01/21 0600) FiO2 (%):  [24 %-28 %] 24 % (01/21 0558)    Intake/Output from previous day: 01/20 0701 - 01/21 0700 In: 582.6 [I.V.:493.1; IV Piggyback:89.5] Out: 1390 [Urine:1050; Emesis/NG output:250; Drains:65; Blood:25] Intake/Output this shift: No intake/output data recorded.  Physical Exam: HEENT - sclerae clear, mucous membranes moist Neck - soft Abdomen - soft, dressing dry and intact; JP serosanguinous; BS present; NG output bilious Ext - no edema, non-tender Neuro - alert & oriented, no focal deficits  Lab Results:  Recent Labs    05/27/22 0530 05/28/22 0423 05/29/22 0308  WBC 17.1* 17.3*  --   HGB 9.1* 7.2* 6.9*  HCT 27.3* 21.9* 22.5*  PLT 411* 349  --    BMET Recent Labs    05/27/22 0530 05/28/22 0423  NA 136 140  K 3.7 3.5  CL 102 107  CO2 25 25  GLUCOSE 151* 96  BUN <5* 5*  CREATININE 0.68 0.85  CALCIUM 8.1* 7.9*   PT/INR No results for input(s): "LABPROT", "INR" in the last 72 hours. Comprehensive Metabolic Panel:    Component Value  Date/Time   NA 140 05/28/2022 0423   NA 136 05/27/2022 0530   K 3.5 05/28/2022 0423   K 3.7 05/27/2022 0530   CL 107 05/28/2022 0423   CL 102 05/27/2022 0530   CO2 25 05/28/2022 0423   CO2 25 05/27/2022 0530   BUN 5 (L) 05/28/2022 0423   BUN <5 (L) 05/27/2022 0530   CREATININE 0.85 05/28/2022 0423   CREATININE 0.68 05/27/2022 0530   GLUCOSE 96 05/28/2022 0423   GLUCOSE 151 (H) 05/27/2022 0530   CALCIUM 7.9 (L) 05/28/2022 0423   CALCIUM 8.1 (L) 05/27/2022 0530   AST 24 05/26/2022 1610   AST 14 (L) 11/27/2020 1225   ALT 11 05/26/2022 1610   ALT 14 11/27/2020 1225   ALKPHOS 41 05/26/2022 1610   ALKPHOS 45 11/27/2020 1225   BILITOT 0.5 05/26/2022 1610   BILITOT 0.6 11/27/2020 1225   PROT 6.9 05/26/2022 1610   PROT 6.6 11/27/2020 1225   ALBUMIN 3.7 05/26/2022 1610   ALBUMIN 3.7 11/27/2020 1225    Studies/Results: No results found.    Armandina Gemma 05/29/2022  Patient ID: Madison Lull  Mejia, female   DOB: 03-12-1986, 37 y.o.   MRN: [id number]

## 2022-05-30 ENCOUNTER — Inpatient Hospital Stay (HOSPITAL_COMMUNITY): Payer: Federal, State, Local not specified - PPO

## 2022-05-30 LAB — HEMOGLOBIN AND HEMATOCRIT, BLOOD
HCT: 21.7 % — ABNORMAL LOW (ref 36.0–46.0)
HCT: 26 % — ABNORMAL LOW (ref 36.0–46.0)
Hemoglobin: 6.8 g/dL — CL (ref 12.0–15.0)
Hemoglobin: 8.5 g/dL — ABNORMAL LOW (ref 12.0–15.0)

## 2022-05-30 LAB — ABO/RH: ABO/RH(D): AB POS

## 2022-05-30 LAB — PREPARE RBC (CROSSMATCH)

## 2022-05-30 MED ORDER — SODIUM CHLORIDE 0.9% IV SOLUTION: Freq: Once | INTRAVENOUS | Status: AC

## 2022-05-30 MED ORDER — METHOCARBAMOL 1000 MG/10ML IJ SOLN: 500.0000 mg | Freq: Four times a day (QID) | INTRAVENOUS | Status: DC | PRN

## 2022-05-30 MED ORDER — HYDROMORPHONE HCL 1 MG/ML IJ SOLN: 1.0000 mg | INTRAMUSCULAR | Status: DC | PRN

## 2022-05-30 MED ORDER — IOHEXOL 300 MG/ML  SOLN
100.0000 mL | Freq: Once | INTRAMUSCULAR | Status: AC | PRN
  Administered 2022-05-30: 150 mL

## 2022-05-30 MED ORDER — ENOXAPARIN SODIUM 40 MG/0.4ML IJ SOSY
40.0000 mg | PREFILLED_SYRINGE | INTRAMUSCULAR | Status: DC
  Administered 2022-05-31 – 2022-06-01 (×2): 40 mg via SUBCUTANEOUS
  Filled 2022-05-30 (×2): qty 0.4

## 2022-05-30 MED ORDER — ACETAMINOPHEN 10 MG/ML IV SOLN
1000.0000 mg | Freq: Four times a day (QID) | INTRAVENOUS | Status: AC
  Administered 2022-05-30 – 2022-05-31 (×4): 1000 mg via INTRAVENOUS
  Filled 2022-05-30 (×6): qty 100

## 2022-05-30 NOTE — Progress Notes (Signed)
Date and time results received: 05/30/22 0315 (use smartphrase ".now" to insert current time)  Test: Hb Critical Value: 6.8  Name of Provider Notified: [facility].left message with answering service at [place] surgery for second time and left message with answering service at [place]? Or Actions Taken?: Received call back from Dr Barry Dienes at [phone]. She stated not to have transfusion for now as the patient was refusing for transfusion previous day.

## 2022-05-30 NOTE — TOC Progression Note (Signed)
Transition of Care [facility]) - Progression Note    Patient Details  Name: Madison Mejia MRN: [id number] Date of Birth: August 29, 1985  [facility]) CM/SW Contact  Levonne Lapping, RN Phone Number: 05/30/2022, 4:22 PM  Clinical Narrative:     Chart reviewed. Plan is still for patient to return home with assistance. Currently receiving IV Zosyn to continue for 5 days post op (Until 1/.25) unless recs change. TOC will continue to follow patient for any additional discharge needs     Expected Discharge Plan: Home/Self Care Barriers to Discharge: Continued Medical Work up  Expected Discharge Plan and Services In-house Referral: NA Discharge Planning Services: NA Post Acute Care Choice: NA Living arrangements for the past 2 months: Apartment                 DME Arranged: N/A DME Agency: NA                   Social Determinants of Health (SDOH) Interventions SDOH Screenings   Food Insecurity: Unknown (05/27/2022)  Housing: Low Mejia  (05/27/2022)  Transportation Needs: No Transportation Needs (05/27/2022)  Utilities: Not At Mejia (05/27/2022)  Depression (PHQ2-9): Low Mejia  (07/13/2021)  Tobacco Use: Low Mejia  (05/27/2022)    Readmission Mejia Interventions     No data to display

## 2022-05-30 NOTE — Progress Notes (Signed)
[facility] Progress Note  4 Days Post-Op  Subjective: CC-  Mother at bedside. Abdomen sore but overall pain well controlled. Has only used PCA 1-2x a day at most. Mobilizing well around the room. Passing flatus and had a very small loose stool. NG with low output. Hgb 6.8  Objective: Vital signs in last 24 hours: Temp:  [98.7 F (37.1 C)] 98.7 F (37.1 C) (01/21 2305) Pulse Rate:  [73-74] 74 (01/21 2305) Resp:  [16-18] 17 (01/22 0336) BP: (108-113)/(75-78) 113/75 (01/21 2305) SpO2:  [98 %-100 %] 100 % (01/22 0838) FiO2 (%):  [21 %] 21 % (01/22 0336)    Intake/Output from previous day: 01/21 0701 - 01/22 0700 In: -  Out: 375 [Emesis/NG output:350; Drains:25] Intake/Output this shift: No intake/output data recorded.  PE: Gen:  Alert, NAD, pleasant Card:  RRR Pulm:  rate and effort normal  Abd: Soft, ND, few BS heard, open midline incision pink with no cellulitis or drainage, JP with serous fluid in bulb  Lab Results:  Recent Labs    05/28/22 0423 05/29/22 0308 05/30/22 0215  WBC 17.3*  --   --   HGB 7.2* 6.9* 6.8*  HCT 21.9* 22.5* 21.7*  PLT 349  --   --    BMET Recent Labs    05/28/22 0423  NA 140  K 3.5  CL 107  CO2 25  GLUCOSE 96  BUN 5*  CREATININE 0.85  CALCIUM 7.9*   PT/INR No results for input(s): "LABPROT", "INR" in the last 72 hours. CMP     Component Value Date/Time   NA 140 05/28/2022 0423   K 3.5 05/28/2022 0423   CL 107 05/28/2022 0423   CO2 25 05/28/2022 0423   GLUCOSE 96 05/28/2022 0423   BUN 5 (L) 05/28/2022 0423   CREATININE 0.85 05/28/2022 0423   CALCIUM 7.9 (L) 05/28/2022 0423   PROT 6.9 05/26/2022 1610   ALBUMIN 3.7 05/26/2022 1610   AST 24 05/26/2022 1610   ALT 11 05/26/2022 1610   ALKPHOS 41 05/26/2022 1610   BILITOT 0.5 05/26/2022 1610   GFRNONAA >60 05/28/2022 0423   Lipase     Component Value Date/Time   LIPASE 35 05/26/2022 1610       Studies/Results: DG UGI W SINGLE CM (SOL OR THIN  BA)  Result Date: 05/30/2022 CLINICAL DATA:  37 year old inpatient with perforation of the anterior gastric body along the lesser curvature status post Phillip Heal patch on May 26, 2022. Patient presents for single upper GI for further evaluation of possible gastric leak EXAM: DG UGI W SINGLE CM TECHNIQUE: Scout radiograph was obtained. Single contrast examination was performed using thin liquid barium. This exam was performed by Rushie Nyhan NP, and was supervised and interpreted by Dr. Abigail Miyamoto. FLUOROSCOPY: Radiation Exposure Index (as provided by the fluoroscopic device): 41 mGy Kerma COMPARISON:  None Available. FINDINGS: Preprocedure scout film demonstrates a nasogastric tube in the distal stomach. Focused, limited exam necessitated by patient immobility and clinical status. Contrast injection via the nasogastric tube demonstrates normal gastric caliber. Despite multiple maneuvers and patient repositioning, gastric distension is suboptimal. Slow transit into the duodenum with poor duodenal and proximal jejunal opacification. IMPRESSION: Mild limitations as detailed above. No evidence of postoperative leak. Electronically Signed   By: Abigail Miyamoto M.D.   On: 05/30/2022 10:26    Anti-infectives: Anti-infectives (From admission, onward)    Start     Dose/Rate Route Frequency Ordered Stop   05/28/22 0000  fluconazole (DIFLUCAN) IVPB  400 mg        400 mg 100 mL/hr over 120 Minutes Intravenous Every 24 hours 05/27/22 0318 06/02/22 0559   05/27/22 0415  piperacillin-tazobactam (ZOSYN) IVPB 3.375 g        3.375 g 12.5 mL/hr over 240 Minutes Intravenous Every 8 hours 05/27/22 0318 06/01/22 0414   05/27/22 0130  fluconazole (DIFLUCAN) IVPB 800 mg        800 mg 100 mL/hr over 240 Minutes Intravenous  Once 05/27/22 0118 05/27/22 1147   05/26/22 2200  piperacillin-tazobactam (ZOSYN) IVPB 3.375 g        3.375 g 100 mL/hr over 30 Minutes Intravenous  Once 05/26/22 2152 05/27/22 0810         Assessment/Plan Perforated gastric ulcer  POD#4 s/p Exploratory laparotomy with Phillip Heal patch repair of perforated gastric ulcer 1/18-19 Dr. Dema Severin - UGI 1/22 negative for leak - D/c NG and start clear liquids - D/c PCA as pt not using very much. Add IV tylenol plus IV robaxin and dilaudid PRN. Likely can add oral pain meds tomorrow if tolerating/advancing diet - continue JP and monitor output - currently serous - IV PPI BID - continue IV diflucan/zosyn. Plan for 5 days postop - H pylori pending - asked RN to send if patient has a bowel movement  ID - zosyn 1/18>>, diflucan 1/19>> FEN - IVF, CLD VTE - SCDs, hold lovenox today given anemia Foley - out and voiding  Acute on chronic anemia - Hgb 6.8 - 1u PRBCs ordered today    LOS: 3 days    Madison Mejia, [facility] 05/30/2022, 11:04 AM Please see Amion for pager number during day hours 7:00am-4:30pm

## 2022-05-31 LAB — TYPE AND SCREEN
ABO/RH(D): AB POS
Antibody Screen: NEGATIVE
Unit division: 0

## 2022-05-31 LAB — BPAM RBC
Blood Product Expiration Date: 202402202359
ISSUE DATE / TIME: 202401221443
Unit Type and Rh: 8400

## 2022-05-31 LAB — BASIC METABOLIC PANEL
Anion gap: 9 (ref 5–15)
BUN: 5 mg/dL — ABNORMAL LOW (ref 6–20)
CO2: 24 mmol/L (ref 22–32)
Calcium: 8.1 mg/dL — ABNORMAL LOW (ref 8.9–10.3)
Chloride: 111 mmol/L (ref 98–111)
Creatinine, Ser: 0.84 mg/dL (ref 0.44–1.00)
GFR, Estimated: >60 mL/min (ref >60)
Glucose, Bld: 99 mg/dL (ref 70–99)
Potassium: 3.5 mmol/L (ref 3.5–5.1)
Sodium: 144 mmol/L (ref 135–145)

## 2022-05-31 LAB — CBC
HCT: 29.1 % — ABNORMAL LOW (ref 36.0–46.0)
Hemoglobin: 9.2 g/dL — ABNORMAL LOW (ref 12.0–15.0)
MCH: 25.1 pg — ABNORMAL LOW (ref 26.0–34.0)
MCHC: 31.6 g/dL (ref 30.0–36.0)
MCV: 79.3 fL — ABNORMAL LOW (ref 80.0–100.0)
Platelets: 364 10*3/uL (ref 150–400)
RBC: 3.67 MIL/uL — ABNORMAL LOW (ref 3.87–5.11)
RDW: 19.9 % — ABNORMAL HIGH (ref 11.5–15.5)
WBC: 6.6 10*3/uL (ref 4.0–10.5)
nRBC: 0 % (ref 0.0–0.2)

## 2022-05-31 MED ORDER — OXYCODONE HCL 5 MG PO TABS
5.0000 mg | ORAL_TABLET | ORAL | Status: DC | PRN
  Administered 2022-05-31: 5 mg via ORAL
  Filled 2022-05-31: qty 1

## 2022-05-31 MED ORDER — ACETAMINOPHEN 500 MG PO TABS
1000.0000 mg | ORAL_TABLET | Freq: Four times a day (QID) | ORAL | Status: DC
  Administered 2022-05-31 – 2022-06-01 (×3): 1000 mg via ORAL
  Filled 2022-05-31 (×5): qty 2

## 2022-05-31 MED ORDER — METHOCARBAMOL 500 MG PO TABS: 500.0000 mg | ORAL_TABLET | Freq: Four times a day (QID) | ORAL | Status: DC | PRN

## 2022-05-31 NOTE — Progress Notes (Signed)
[facility] Progress Note  5 Days Post-Op  Subjective: CC-  Mother at bedside. Tolerated CLD. +flatus and semi solid stools. Mobilizing. Denies nausea,vomiting, belching. Pt plans to stay with her her mother in [place] for a little while before returning to [place] where she lives alone and works at [employer].   Objective: Vital signs in last 24 hours: Temp:  [97.5 F (36.4 C)-99 F (37.2 C)] 98 F (36.7 C) (01/23 0000) Pulse Rate:  [54-65] 54 (01/23 0400) Resp:  [15-21] 15 (01/23 0400) BP: (105-144)/(70-91) 144/91 (01/23 0400) SpO2:  [97 %-100 %] 97 % (01/23 0400) Last BM Date : 05/30/22  Intake/Output from previous day: 01/22 0701 - 01/23 0700 In: 3317.3 [I.V.:2047; Blood:416.3; IV Piggyback:854.1] Out: 35 [Drains:35] Intake/Output this shift: No intake/output data recorded.  PE: Gen:  Alert, NAD, pleasant Card:  RRR Pulm:  rate and effort normal  Abd: Soft, ND, +BS, open midline incision pink with no cellulitis or drainage, JP with serous fluid in bulb  Lab Results:  Recent Labs    05/30/22 2003 05/31/22 0723  WBC  --  6.6  HGB 8.5* 9.2*  HCT 26.0* 29.1*  PLT  --  364   BMET Recent Labs    05/31/22 0723  NA 144  K 3.5  CL 111  CO2 24  GLUCOSE 99  BUN 5*  CREATININE 0.84  CALCIUM 8.1*   PT/INR No results for input(s): "LABPROT", "INR" in the last 72 hours. CMP     Component Value Date/Time   NA 144 05/31/2022 0723   K 3.5 05/31/2022 0723   CL 111 05/31/2022 0723   CO2 24 05/31/2022 0723   GLUCOSE 99 05/31/2022 0723   BUN 5 (L) 05/31/2022 0723   CREATININE 0.84 05/31/2022 0723   CALCIUM 8.1 (L) 05/31/2022 0723   PROT 6.9 05/26/2022 1610   ALBUMIN 3.7 05/26/2022 1610   AST 24 05/26/2022 1610   ALT 11 05/26/2022 1610   ALKPHOS 41 05/26/2022 1610   BILITOT 0.5 05/26/2022 1610   GFRNONAA >60 05/31/2022 0723   Lipase     Component Value Date/Time   LIPASE 35 05/26/2022 1610       Studies/Results: DG UGI W SINGLE CM (SOL OR  THIN BA)  Result Date: 05/30/2022 CLINICAL DATA:  37 year old inpatient with perforation of the anterior gastric body along the lesser curvature status post Phillip Heal patch on May 26, 2022. Patient presents for single upper GI for further evaluation of possible gastric leak EXAM: DG UGI W SINGLE CM TECHNIQUE: Scout radiograph was obtained. Single contrast examination was performed using thin liquid barium. This exam was performed by Rushie Nyhan NP, and was supervised and interpreted by Dr. Abigail Miyamoto. FLUOROSCOPY: Radiation Exposure Index (as provided by the fluoroscopic device): 41 mGy Kerma COMPARISON:  None Available. FINDINGS: Preprocedure scout film demonstrates a nasogastric tube in the distal stomach. Focused, limited exam necessitated by patient immobility and clinical status. Contrast injection via the nasogastric tube demonstrates normal gastric caliber. Despite multiple maneuvers and patient repositioning, gastric distension is suboptimal. Slow transit into the duodenum with poor duodenal and proximal jejunal opacification. IMPRESSION: Mild limitations as detailed above. No evidence of postoperative leak. Electronically Signed   By: Abigail Miyamoto M.D.   On: 05/30/2022 10:26    Anti-infectives: Anti-infectives (From admission, onward)    Start     Dose/Rate Route Frequency Ordered Stop   05/28/22 0000  fluconazole (DIFLUCAN) IVPB 400 mg        400 mg 100  mL/hr over 120 Minutes Intravenous Every 24 hours 05/27/22 0318 06/02/22 0559   05/27/22 0415  piperacillin-tazobactam (ZOSYN) IVPB 3.375 g        3.375 g 12.5 mL/hr over 240 Minutes Intravenous Every 8 hours 05/27/22 0318 06/01/22 0414   05/27/22 0130  fluconazole (DIFLUCAN) IVPB 800 mg        800 mg 100 mL/hr over 240 Minutes Intravenous  Once 05/27/22 0118 05/27/22 1147   05/26/22 2200  piperacillin-tazobactam (ZOSYN) IVPB 3.375 g        3.375 g 100 mL/hr over 30 Minutes Intravenous  Once 05/26/22 2152 05/27/22 0810         Assessment/Plan Perforated gastric ulcer  POD#5 s/p Exploratory laparotomy with Phillip Heal patch repair of perforated gastric ulcer 1/18-19 Dr. Dema Severin - UGI 1/22 negative for leak - Advance to FLD - start PO pain control with tylenol, robaxin, PRN oxycodone - continue JP and monitor output - currently serous, plan to D/C drain prior to discharge  - IV PPI BID - continue IV diflucan/zosyn. Plan for 5 days postop, last day today - H pylori pending - asked RN to send if patient has a bowel movement  ID - zosyn 1/18>>, diflucan 1/19>> FEN - IVF, CLD VTE - SCDs, resume lovenox Foley - out and voiding  Acute on chronic anemia - 1u PRBCs 1/22 for Hgb 6.8 - hgb up to 9.2 today.  Dispo: advance diet, PO pain control,  follow h pylori, wound care teaching to pt and mother, [facility] RN ordered.  Anticipate discharge in 24-48 hours.    LOS: 4 days    [facility] 05/31/2022, 8:21 AM Please see Amion for pager number during day hours 7:00am-4:30pm

## 2022-06-01 ENCOUNTER — Other Ambulatory Visit (HOSPITAL_COMMUNITY): Payer: Self-pay

## 2022-06-01 LAB — H. PYLORI ANTIGEN, STOOL: H. Pylori Stool Ag, Eia: POSITIVE — AB

## 2022-06-01 MED ORDER — METRONIDAZOLE 500 MG PO TABS
500.0000 mg | ORAL_TABLET | Freq: Four times a day (QID) | ORAL | Status: DC
  Administered 2022-06-01 – 2022-06-02 (×3): 500 mg via ORAL
  Filled 2022-06-01 (×3): qty 1

## 2022-06-01 MED ORDER — TETRACYCLINE HCL 250 MG PO CAPS
500.0000 mg | ORAL_CAPSULE | Freq: Four times a day (QID) | ORAL | Status: DC
  Administered 2022-06-01 – 2022-06-02 (×3): 500 mg via ORAL
  Filled 2022-06-01 (×6): qty 2

## 2022-06-01 MED ORDER — PANTOPRAZOLE SODIUM 40 MG PO TBEC
40.0000 mg | DELAYED_RELEASE_TABLET | Freq: Two times a day (BID) | ORAL | Status: DC
  Administered 2022-06-01 – 2022-06-02 (×3): 40 mg via ORAL
  Filled 2022-06-01 (×3): qty 1

## 2022-06-01 MED ORDER — BISMUTH SUBSALICYLATE 262 MG PO CHEW
524.0000 mg | CHEWABLE_TABLET | Freq: Four times a day (QID) | ORAL | Status: DC
  Administered 2022-06-01 (×2): 524 mg via ORAL
  Filled 2022-06-01 (×7): qty 2

## 2022-06-01 MED ORDER — OXYCODONE HCL 5 MG PO TABS: 5.0000 mg | ORAL_TABLET | ORAL | Status: DC | PRN

## 2022-06-01 MED ORDER — HYDROMORPHONE HCL 1 MG/ML IJ SOLN: 1.0000 mg | INTRAMUSCULAR | Status: DC | PRN

## 2022-06-01 NOTE — Progress Notes (Signed)
Pharmacy - H Pylori   Pylera pack pended for discharge for H Pylori treatment.  Pharmacy does not stock the correct dosages for the individual drugs to be administered inpatient.   To take 3 capsules 4 times a day for 10 days  To be administered with a PPI bid  Each Pylera capsule contains: 140 mg of bismuth subcitrate K 125 mg of metronidazole  125 mg of tetracycline.  Thank you  Anette Guarneri, PharmD

## 2022-06-01 NOTE — Discharge Instructions (Signed)
Wet to Dry WOUND CARE: - Change dressing twice daily - Supplies: sterile saline, kerlex, scissors, ABD pads, tape  Remove dressing and all packing carefully, moistening with sterile saline as needed to avoid packing/internal dressing sticking to the wound. 2.   Clean edges of skin around the wound with water/gauze, making sure there is no tape debris or leakage left on skin that could cause skin irritation or breakdown. 3.   Dampen and clean kerlex with sterile saline and pack wound from wound base to skin level, making sure to take note of any possible areas of wound tracking, tunneling and packing appropriately. Wound can be packed loosely. Trim kerlex to size if a whole kerlex is not required. 4.   Cover wound with a dry ABD pad and secure with tape.  5.   Write the date/time on the dry dressing/tape to better track when the last dressing change occurred. - apply any skin protectant/powder if recommended by clinician to protect skin/skin folds. - change dressing as needed if leakage occurs, wound gets contaminated, or patient requests to shower. - You may shower daily with wound open and following the shower the wound should be dried and a clean dressing placed.  - Medical grade tape as well as packing supplies can be found at [facility] on [address] or [facility] on [address]. The remaining supplies can be found at your local drug store, [employer] etc.  CCS      [facility] [phone]  OPEN ABDOMINAL SURGERY: POST OP INSTRUCTIONS  Always review your discharge instruction sheet given to you by the facility where your surgery was performed.  IF YOU HAVE DISABILITY OR FAMILY LEAVE FORMS, YOU MUST BRING THEM TO THE OFFICE FOR PROCESSING.  PLEASE DO NOT GIVE THEM TO YOUR DOCTOR.  A prescription for pain medication may be given to you upon discharge.  Take your pain medication as prescribed, if needed.  If narcotic pain medicine is not needed,  then you may take acetaminophen (Tylenol) or ibuprofen (Advil) as needed. Take your usually prescribed medications unless otherwise directed. If you need a refill on your pain medication, please contact your pharmacy. They will contact our office to request authorization.  Prescriptions will not be filled after 5pm or on week-ends. You should follow a light diet the first few days after arrival home, such as soup and crackers, pudding, etc.unless your doctor has advised otherwise. A high-fiber, low fat diet can be resumed as tolerated.   Be sure to include lots of fluids daily. Most patients will experience some swelling and bruising on the chest and neck area.  Ice packs will help.  Swelling and bruising can take several days to resolve Most patients will experience some swelling and bruising in the area of the incision. Ice pack will help. Swelling and bruising can take several days to resolve..  It is common to experience some constipation if taking pain medication after surgery.  Increasing fluid intake and taking a stool softener will usually help or prevent this problem from occurring.  A mild laxative (Milk of Magnesia or Miralax) should be taken according to package directions if there are no bowel movements after 48 hours.  ACTIVITIES:  You may resume regular (light) daily activities beginning the next day--such as daily self-care, walking, climbing stairs--gradually increasing activities as tolerated.  You may have sexual intercourse when it is comfortable.  Refrain from any heavy lifting or straining until approved by your doctor. You may drive   when you no longer are taking prescription pain medication, you can comfortably wear a seatbelt, and you can safely maneuver your car and apply brakes You should see your doctor in the office for a follow-up appointment approximately two weeks after your surgery.  Make sure that you call for this appointment within a day or two after you arrive home to  insure a convenient appointment time.  WHEN TO CALL YOUR DOCTOR: Fever over 101.0 Inability to urinate Nausea and/or vomiting Extreme swelling or bruising Continued bleeding from incision. Increased pain, redness, or drainage from the incision. Difficulty swallowing or breathing Muscle cramping or spasms. Numbness or tingling in hands or feet or around lips.  The clinic staff is available to answer your questions during regular business hours.  Please don't hesitate to call and ask to speak to one of the nurses if you have concerns.  For further questions, please visit [email or web address]   

## 2022-06-01 NOTE — TOC Progression Note (Addendum)
Transition of Care [facility]) - Progression Note    Patient Details  Name: Madison Mejia MRN: [id number] Date of Birth: 07-20-1985  Transition [facility]) CM/SW Contact  Levonne Lapping, RN Phone Number: 06/01/2022, 9:18 AM  Clinical Narrative:    Update: 3:56 PM CM spoke with [facility]  Fax of referral and dc address received. They will process now   CM spoke with patient regarding her plan to DC to her Mom's in [place], [place] ([id number]).  Choice list printed for [facility] wound care. CM will arrange after choice. TOC will continue to follow patient for any additional discharge needs     Expected Discharge Plan: Home/Self Care Barriers to Discharge: Continued Medical Work up  Expected Discharge Plan and Services In-house Referral: NA Discharge Planning Services: NA Post Acute Care Choice: NA Living arrangements for the past 2 months: Apartment                 DME Arranged: N/A DME Agency: NA                   Social Determinants of Health (SDOH) Interventions SDOH Screenings   Food Insecurity: Unknown (05/27/2022)  Housing: Low Risk  (05/27/2022)  Transportation Needs: No Transportation Needs (05/27/2022)  Utilities: Not At Risk (05/27/2022)  Depression (PHQ2-9): Low Risk  (07/13/2021)  Tobacco Use: Low Risk  (05/27/2022)    Readmission Risk Interventions     No data to display

## 2022-06-01 NOTE — Progress Notes (Signed)
[facility] Progress Note  6 Days Post-Op  Subjective: CC-  Family at bedside. Abdomen sore but pain well controlled with oral medications. Tolerating full liquids. Denies n/v. BM yesterday.  Objective: Vital signs in last 24 hours: Temp:  [97.6 F (36.4 C)-98.7 F (37.1 C)] 97.7 F (36.5 C) (01/24 0717) Pulse Rate:  [60-78] 78 (01/24 0717) Resp:  [17-20] 18 (01/24 0717) BP: (112-136)/(79-88) 136/85 (01/24 0717) SpO2:  [98 %] 98 % (01/24 0717) Last BM Date : 05/30/22  Intake/Output from previous day: 01/23 0701 - 01/24 0700 In: 228.4 [IV Piggyback:228.4] Out: 75 [Drains:75] Intake/Output this shift: No intake/output data recorded.  PE: Gen:  Alert, NAD, pleasant Card:  RRR Pulm:  rate and effort normal  Abd: Soft, ND, +BS, open midline incision with cdi dressing, JP with serous fluid in bulb  Lab Results:  Recent Labs    05/30/22 2003 05/31/22 0723  WBC  --  6.6  HGB 8.5* 9.2*  HCT 26.0* 29.1*  PLT  --  364   BMET Recent Labs    05/31/22 0723  NA 144  K 3.5  CL 111  CO2 24  GLUCOSE 99  BUN 5*  CREATININE 0.84  CALCIUM 8.1*   PT/INR No results for input(s): "LABPROT", "INR" in the last 72 hours. CMP     Component Value Date/Time   NA 144 05/31/2022 0723   K 3.5 05/31/2022 0723   CL 111 05/31/2022 0723   CO2 24 05/31/2022 0723   GLUCOSE 99 05/31/2022 0723   BUN 5 (L) 05/31/2022 0723   CREATININE 0.84 05/31/2022 0723   CALCIUM 8.1 (L) 05/31/2022 0723   PROT 6.9 05/26/2022 1610   ALBUMIN 3.7 05/26/2022 1610   AST 24 05/26/2022 1610   ALT 11 05/26/2022 1610   ALKPHOS 41 05/26/2022 1610   BILITOT 0.5 05/26/2022 1610   GFRNONAA >60 05/31/2022 0723   Lipase     Component Value Date/Time   LIPASE 35 05/26/2022 1610       Studies/Results: DG UGI W SINGLE CM (SOL OR THIN BA)  Result Date: 05/30/2022 CLINICAL DATA:  37 year old inpatient with perforation of the anterior gastric body along the lesser curvature status post  Phillip Heal patch on May 26, 2022. Patient presents for single upper GI for further evaluation of possible gastric leak EXAM: DG UGI W SINGLE CM TECHNIQUE: Scout radiograph was obtained. Single contrast examination was performed using thin liquid barium. This exam was performed by Rushie Nyhan NP, and was supervised and interpreted by Dr. Abigail Miyamoto. FLUOROSCOPY: Radiation Exposure Index (as provided by the fluoroscopic device): 41 mGy Kerma COMPARISON:  None Available. FINDINGS: Preprocedure scout film demonstrates a nasogastric tube in the distal stomach. Focused, limited exam necessitated by patient immobility and clinical status. Contrast injection via the nasogastric tube demonstrates normal gastric caliber. Despite multiple maneuvers and patient repositioning, gastric distension is suboptimal. Slow transit into the duodenum with poor duodenal and proximal jejunal opacification. IMPRESSION: Mild limitations as detailed above. No evidence of postoperative leak. Electronically Signed   By: Abigail Miyamoto M.D.   On: 05/30/2022 10:26    Anti-infectives: Anti-infectives (From admission, onward)    Start     Dose/Rate Route Frequency Ordered Stop   05/28/22 0000  fluconazole (DIFLUCAN) IVPB 400 mg        400 mg 100 mL/hr over 120 Minutes Intravenous Every 24 hours 05/27/22 0318 06/01/22 0723   05/27/22 0415  piperacillin-tazobactam (ZOSYN) IVPB 3.375 g  3.375 g 12.5 mL/hr over 240 Minutes Intravenous Every 8 hours 05/27/22 0318 06/01/22 0526   05/27/22 0130  fluconazole (DIFLUCAN) IVPB 800 mg        800 mg 100 mL/hr over 240 Minutes Intravenous  Once 05/27/22 0118 05/27/22 1147   05/26/22 2200  piperacillin-tazobactam (ZOSYN) IVPB 3.375 g        3.375 g 100 mL/hr over 30 Minutes Intravenous  Once 05/26/22 2152 05/27/22 0810        Assessment/Plan Perforated gastric ulcer  POD#6 s/p Exploratory laparotomy with Phillip Heal patch repair of perforated gastric ulcer 1/18-19 Dr. Dema Severin - UGI  1/22 negative for leak - Advance to soft diet - continue JP and monitor output - currently serous, plan to D/C drain prior to discharge  - changed PPI BID to PO - completed 5 days of IV diflucan/zosyn - H pylori is positive, will start treatment - Family feels comfortable with wound care. Home health has also been ordered.  Likely will be ready for discharge tomorrow.   ID - zosyn 1/18>>1/24, diflucan 1/19>>1/24 FEN - SLIV, soft diet VTE - SCDs, lovenox Foley - out and voiding   Acute on chronic anemia - 1u PRBCs 1/22 for Hgb 6.8. H/h stable since (9.2 on 1/23)    LOS: 5 days    Wellington Hampshire, [facility] 06/01/2022, 9:23 AM Please see Amion for pager number during day hours 7:00am-4:30pm

## 2022-06-02 ENCOUNTER — Other Ambulatory Visit (HOSPITAL_COMMUNITY): Payer: Self-pay

## 2022-06-02 MED ORDER — OXYCODONE HCL 5 MG PO TABS
5.0000 mg | ORAL_TABLET | Freq: Four times a day (QID) | ORAL | 0 refills | Status: DC | PRN
  Filled 2022-06-02: qty 10, 3d supply, fill #0

## 2022-06-02 MED ORDER — ACETAMINOPHEN 500 MG PO TABS: 1000.0000 mg | ORAL_TABLET | Freq: Four times a day (QID) | ORAL | 0 refills | Status: DC | PRN

## 2022-06-02 MED ORDER — BISMUTH/METRONIDAZ/TETRACYCLIN 140-125-125 MG PO CAPS
3.0000 | ORAL_CAPSULE | Freq: Three times a day (TID) | ORAL | 0 refills | Status: DC
  Filled 2022-06-02: qty 120, 10d supply, fill #0

## 2022-06-02 MED ORDER — PANTOPRAZOLE SODIUM 40 MG PO TBEC
40.0000 mg | DELAYED_RELEASE_TABLET | Freq: Two times a day (BID) | ORAL | 1 refills | Status: DC
  Filled 2022-06-02: qty 60, 30d supply, fill #0

## 2022-06-02 NOTE — TOC Transition Note (Signed)
Transition of Care [facility]) - CM/SW Discharge Note   Patient Details  Name: Madison Mejia MRN: [id number] Date of Birth: Jun 25, 1985  Transition [facility]) CM/SW Contact:  Levonne Lapping, RN Phone Number: 06/02/2022, 12:20 PM   Clinical Narrative:    Patient to DC to Mom's home in [place] today. Mom's address: [address] [place], [place]  [address]. [facility] will be providing skilled nursing services for wound care. Patient will follow up as directed in DC AVS. Mom to transport. No additional TOC needs       Barriers to Discharge: Continued Medical Work up   Patient Goals and CMS Choice   Choice offered to / list presented to : NA  Discharge Placement                         Discharge Plan and Services Additional resources added to the After Visit Summary for   In-house Referral: NA Discharge Planning Services: NA Post Acute Care Choice: NA          DME Arranged: N/A DME Agency: NA                  Social Determinants of Health (SDOH) Interventions SDOH Screenings   Food Insecurity: Unknown (05/27/2022)  Housing: Low Risk  (05/27/2022)  Transportation Needs: No Transportation Needs (05/27/2022)  Utilities: Not At Risk (05/27/2022)  Depression (PHQ2-9): Low Risk  (07/13/2021)  Tobacco Use: Low Risk  (05/27/2022)     Readmission Risk Interventions     No data to display

## 2022-06-02 NOTE — Discharge Summary (Signed)
[facility] Discharge Summary   Patient ID: Madison Mejia MRN: [id number] DOB/AGE: June 23, 1985 37 y.o.  Admit date: 05/26/2022 Discharge date: 06/02/2022  Admitting Diagnosis: Perforated viscus   Discharge Diagnosis Patient Active Problem List   Diagnosis Date Noted   Gastric perforation, acute 05/27/2022   Dysmenorrhea 12/15/2020    Consultants None  Imaging: No results found.  Procedures Dr. Dema Severin (05/26/2022) - Exploratory laparotomy with Phillip Heal patch repair of perforated gastric ulcer  Hospital Course:  Madison Mejia is a 37 y.o. female who presented to [facility] 05/26/22 with 4 days history of progressively worsening abdominal pain.  Workup included CT scan which showed perforated viscus with suspected perforated peptic ulcer disease.  Patient was taken emergently to the operating room and found to have a perforated gastric body ulcer. She underwent Exploratory laparotomy with Phillip Heal patch repair of perforated gastric ulcer. Tolerated procedure well and was transferred to the floor.  UGI on 1/22 negative for leak, therefore NG was removed and diet advanced as tolerated. She completed 5 days of zosyn and diflucan postoperatively. H pylori came back positive and patient was started on treatment, and will be sent home with Pylera x10 days and protonix BID. She will need outpatient referral to gastroenterology for follow up. On 1/25 the patient was voiding well, tolerating diet, ambulating well, pain well controlled, vital signs stable, incisions c/d/i and comfortable with wound care, and felt stable for discharge home.  JP drain removed prior to discharge. Patient will follow up as below and knows to call with questions or concerns.    I have personally reviewed the patients medication history on the [place] controlled substance database.    Physical Exam: Gen:  Alert, NAD, pleasant Card:  RRR Pulm:  rate and effort normal  Abd: Soft, ND, +BS, open midline cdi, JP with  serous fluid in bulb   Allergies as of 06/02/2022       Reactions   Nsaids Anaphylaxis        Medication List     STOP taking these medications    FLUoxetine 10 MG tablet Commonly known as: PROZAC   levonorgestrel-ethinyl estradiol 0.15-30 MG-MCG tablet Commonly known as: NORDETTE       TAKE these medications    acetaminophen 500 MG tablet Commonly known as: TYLENOL Take 2 tablets (1,000 mg total) by mouth every 6 (six) hours as needed for mild pain.   Bismuth/Metronidaz/Tetracyclin 140-125-125 MG Caps Commonly known as: Pylera Take 3 capsules by mouth 4 (four) times daily - after meals and at bedtime for 10 days. Take as directed in package   oxyCODONE 5 MG immediate release tablet Commonly known as: Oxy IR/ROXICODONE Take 1 tablet (5 mg total) by mouth every 6 (six) hours as needed for severe pain.   pantoprazole 40 MG tablet Commonly known as: PROTONIX Take 1 tablet (40 mg total) by mouth 2 (two) times daily.          Follow-up Information     Ileana Roup, MD. Call.   Specialties: General Surgery, Colon and Rectal Surgery Why: We are working on your appointment,call to confirm, Arrive 5min early to check in, fill out paperwork, Engineer, civil (consulting) ID and insurance information Contact information: [place] [id number] [phone]                  Signed: Wellington Hampshire, [facility] 06/02/2022, 9:56 AM Please see Amion for pager number during day hours 7:00am-4:30pm

## 2022-06-03 DIAGNOSIS — R109 Unspecified abdominal pain: Secondary | ICD-10-CM | POA: Diagnosis not present

## 2022-06-08 DIAGNOSIS — R109 Unspecified abdominal pain: Secondary | ICD-10-CM | POA: Diagnosis not present

## 2022-07-01 ENCOUNTER — Encounter: Payer: Self-pay | Admitting: Gastroenterology

## 2022-07-13 ENCOUNTER — Encounter: Payer: Self-pay | Admitting: Gastroenterology

## 2022-07-13 ENCOUNTER — Ambulatory Visit: Payer: Federal, State, Local not specified - PPO | Admitting: Gastroenterology

## 2022-07-13 VITALS — BP 89/68 | HR 70 | Ht 63.0 in | Wt 148.0 lb

## 2022-07-13 DIAGNOSIS — K251 Acute gastric ulcer with perforation: Secondary | ICD-10-CM

## 2022-07-13 NOTE — Progress Notes (Signed)
Referring Provider: Ileana Roup, MD Primary Care Physician:  Cletis Athens, MD  Reason for Consultation: Perforated gastric ulcer   IMPRESSION:  Perforated gastric ulcer status post exploratory laparotomy with Phillip Heal patch repair 05/26/2022  H. pylori gastritis treated with bismuth, Flagyl, and tetracycline  PLAN: EGD for further evaluation and gastric biopsies to document resolution of H. Pylori  We discussed the risks of endoscopic evaluation today.     HPI: Madison Mejia is a 37 y.o. female referred by Dr. Nadeen Landau after a gastric ulcer perforation.  The history is obtained through the patient and review of her electronic health record.  She was admitted with a perforated gastric ulcer 05/26/2022.  For the 6 months preceding her presentation she had weight loss and ultimately presented with escalating abdominal pain, nausea, and vomiting.  She was taken emergently to the operating room for exploratory laparotomy with Phillip Heal patch repair of the perforated gastric ulcer 05/26/2022.  An upper GI 05/30/2022 showed no evidence of leak.  Her H. pylori was positive.  She was treated with bismuth, Flagyl, and tetracycline.  There was no significant NSAID history.   She has no ongoing GI symptoms. Completed H pylori treatment without difficulty. Has been gaining weight since the surgery because she feels so much better. Reports 100% adherence with PPI therapy.  There is no known family history of colon cancer or polyps. No family history of stomach cancer or other GI malignancy. No family history of inflammatory bowel disease or celiac.    Past Medical History:  Diagnosis Date   Anemia     Past Surgical History:  Procedure Laterality Date   LAPAROTOMY N/A 05/26/2022   Procedure: EXPLORATORY LAPAROTOMY GRAHAM PATCH REPAIR;  Surgeon: Ileana Roup, MD;  Location: [facility] OR;  Service: General;  Laterality: N/A;   ulcer surgery       Current Outpatient Medications   Medication Sig Dispense Refill   pantoprazole (PROTONIX) 40 MG tablet Take 1 tablet (40 mg total) by mouth 2 (two) times daily. 60 tablet 1   acetaminophen (TYLENOL) 500 MG tablet Take 2 tablets (1,000 mg total) by mouth every 6 (six) hours as needed for mild pain. (Patient not taking: Reported on 07/13/2022) 30 tablet 0   Bismuth/Metronidaz/Tetracyclin (PYLERA) 140-125-125 MG CAPS Take 3 capsules by mouth 4 (four) times daily - after meals and at bedtime for 10 days. Take as directed in package 120 capsule 0   oxyCODONE (OXY IR/ROXICODONE) 5 MG immediate release tablet Take 1 tablet (5 mg total) by mouth every 6 (six) hours as needed for severe pain. (Patient not taking: Reported on 07/13/2022) 10 tablet 0   No current facility-administered medications for this visit.    Allergies as of 07/13/2022 - Review Complete 07/13/2022  Allergen Reaction Noted   Nsaids Anaphylaxis 05/27/2022    Family History  Problem Relation Age of Onset   Colon cancer Neg Hx    Esophageal cancer Neg Hx    Stomach cancer Neg Hx     Social History   Socioeconomic History   Marital status: Single    Spouse name: Not on file   Number of children: 0   Years of education: Not on file   Highest education level: Not on file  Occupational History   Occupation: post Animal nutritionist  Tobacco Use   Smoking status: Never   Smokeless tobacco: Never  Vaping Use   Vaping Use: Never used  Substance and Sexual Activity   Alcohol use: Not  Currently   Drug use: Never   Sexual activity: Not Currently    Birth control/protection: None  Other Topics Concern   Not on file  Social History Narrative   Not on file   Social Determinants of Health   Financial Resource Strain: Not on file  Food Insecurity: Unknown (05/27/2022)   Hunger Vital Sign    Worried About Running Out of Food in the Last Year: Not on file    Ran Out of Food in the Last Year: Never true  Transportation Needs: No Transportation Needs (05/27/2022)    PRAPARE - Hydrologist (Medical): No    Lack of Transportation (Non-Medical): No  Physical Activity: Not on file  Stress: Not on file  Social Connections: Not on file  Intimate Partner Violence: Not At Risk (05/27/2022)   Humiliation, Afraid, Rape, and Kick questionnaire    Fear of Current or Ex-Partner: No    Emotionally Abused: No    Physically Abused: No    Sexually Abused: No    Review of Systems: 12 system ROS is negative except as noted above.   Physical Exam: General:   Alert,  well-nourished, pleasant and cooperative in NAD Head:  Normocephalic and atraumatic. Eyes:  Sclera clear, no icterus.   Conjunctiva pink. Ears:  Normal auditory acuity. Nose:  No deformity, discharge,  or lesions. Mouth:  No deformity or lesions.   Neck:  Supple; no masses or thyromegaly. Lungs:  Clear throughout to auscultation.   No wheezes. Heart:  Regular rate and rhythm; no murmurs. Abdomen:  Soft, nontender, nondistended, normal bowel sounds, no rebound or guarding. No hepatosplenomegaly.   Rectal:  Deferred  Msk:  Symmetrical. No boney deformities LAD: No inguinal or umbilical LAD Extremities:  No clubbing or edema. Neurologic:  Alert and  oriented x4;  grossly nonfocal Skin:  Intact without significant lesions or rashes. Psych:  Alert and cooperative. Normal mood and affect.    Roi Jafari L. Tarri Glenn, MD, MPH 07/13/2022, 10:00 AM

## 2022-07-13 NOTE — Patient Instructions (Signed)
You have been scheduled for an endoscopy. Please follow written instructions given to you at your visit today. If you use inhalers (even only as needed), please bring them with you on the day of your procedure.  _______________________________________________________  If your blood pressure at your visit was 140/90 or greater, please contact your primary care physician to follow up on this.  _______________________________________________________  If you are age 35 or older, your body mass index should be between 23-30. Your Body mass index is 26.22 kg/m. If this is out of the aforementioned range listed, please consider follow up with your Primary Care Provider.  If you are age 11 or younger, your body mass index should be between 19-25. Your Body mass index is 26.22 kg/m. If this is out of the aformentioned range listed, please consider follow up with your Primary Care Provider.   ________________________________________________________  The [facility] providers would like to encourage you to use [facility] to communicate with providers for non-urgent requests or questions.  Due to long hold times on the telephone, sending your provider a message by [facility] may be a faster and more efficient way to get a response.  Please allow 48 business hours for a response.  Please remember that this is for non-urgent requests.  _______________________________________________________

## 2022-07-15 ENCOUNTER — Ambulatory Visit (INDEPENDENT_AMBULATORY_CARE_PROVIDER_SITE_OTHER): Payer: Federal, State, Local not specified - PPO | Admitting: Obstetrics & Gynecology

## 2022-07-15 ENCOUNTER — Other Ambulatory Visit (HOSPITAL_COMMUNITY)
Admission: RE | Admit: 2022-07-15 | Discharge: 2022-07-15 | Disposition: A | Discharge status: Home / Self Care | Payer: Federal, State, Local not specified - PPO | Source: Ambulatory Visit | Attending: Obstetrics & Gynecology | Admitting: Obstetrics & Gynecology

## 2022-07-15 VITALS — BP 102/66 | HR 78 | Wt 146.0 lb

## 2022-07-15 DIAGNOSIS — N898 Other specified noninflammatory disorders of vagina: Secondary | ICD-10-CM

## 2022-07-15 DIAGNOSIS — Z113 Encounter for screening for infections with a predominantly sexual mode of transmission: Secondary | ICD-10-CM | POA: Insufficient documentation

## 2022-07-15 NOTE — Progress Notes (Signed)
Pt states she had vaginal itching but has since resolved.  Pt states she would still like to be checked for infection.

## 2022-07-15 NOTE — Progress Notes (Signed)
Patient ID: Madison Mejia, female   DOB: 1985/08/09, 37 y.o.   MRN: [id number]  Chief Complaint  Patient presents with   Vaginitis    HPI Madison Mejia is a 37 y.o. female.  [id number] Patient's last menstrual period was 06/17/2022. She had some vaginal itching that has since resolved but she would like testing to r/o STD HPI  Past Medical History:  Diagnosis Date   Anemia     Past Surgical History:  Procedure Laterality Date   LAPAROTOMY N/A 05/26/2022   Procedure: EXPLORATORY LAPAROTOMY GRAHAM PATCH REPAIR;  Surgeon: Ileana Roup, MD;  Location: [facility] OR;  Service: General;  Laterality: N/A;   ulcer surgery      Family History  Problem Relation Age of Onset   Colon cancer Neg Hx    Esophageal cancer Neg Hx    Stomach cancer Neg Hx     Social History Social History   Tobacco Use   Smoking status: Never   Smokeless tobacco: Never  Vaping Use   Vaping Use: Never used  Substance Use Topics   Alcohol use: Not Currently   Drug use: Never    Allergies  Allergen Reactions   Nsaids Anaphylaxis    Current Outpatient Medications  Medication Sig Dispense Refill   pantoprazole (PROTONIX) 40 MG tablet Take 1 tablet (40 mg total) by mouth 2 (two) times daily. 60 tablet 1   acetaminophen (TYLENOL) 500 MG tablet Take 2 tablets (1,000 mg total) by mouth every 6 (six) hours as needed for mild pain. (Patient not taking: Reported on 07/13/2022) 30 tablet 0   Bismuth/Metronidaz/Tetracyclin (PYLERA) 140-125-125 MG CAPS Take 3 capsules by mouth 4 (four) times daily - after meals and at bedtime for 10 days. Take as directed in package 120 capsule 0   oxyCODONE (OXY IR/ROXICODONE) 5 MG immediate release tablet Take 1 tablet (5 mg total) by mouth every 6 (six) hours as needed for severe pain. (Patient not taking: Reported on 07/13/2022) 10 tablet 0   No current facility-administered medications for this visit.    Review of Systems Review of Systems  Constitutional: Negative.    Respiratory: Negative.    Cardiovascular: Negative.   Genitourinary:  Positive for menstrual problem and vaginal discharge (itching resolved with no discharge currently).    Blood pressure 102/66, pulse 78, weight 66.2 kg, last menstrual period 06/17/2022.  Physical Exam Physical Exam Vitals and nursing note reviewed. Exam conducted with a chaperone present.  Constitutional:      Appearance: Normal appearance.  Cardiovascular:     Rate and Rhythm: Normal rate.  Pulmonary:     Effort: Pulmonary effort is normal.  Genitourinary:    General: Normal vulva.     Vagina: No vaginal discharge.  Neurological:     Mental Status: She is alert.  Psychiatric:        Mood and Affect: Mood normal.        Behavior: Behavior normal.     Data Reviewed Pap negative 07/2021  Assessment STD screening after recent vaginal symptoms  Plan F/u on STD testing Annual exam 12 mo    Madison Mejia 07/15/2022, 10:35 AM

## 2022-07-16 LAB — RPR: RPR Ser Ql: NONREACTIVE

## 2022-07-16 LAB — HEPATITIS C ANTIBODY: Hep C Virus Ab: NONREACTIVE

## 2022-07-16 LAB — HIV ANTIBODY (ROUTINE TESTING W REFLEX): HIV Screen 4th Generation wRfx: NONREACTIVE

## 2022-07-16 LAB — HEPATITIS B SURFACE ANTIGEN: Hepatitis B Surface Ag: NEGATIVE

## 2022-07-18 LAB — CERVICOVAGINAL ANCILLARY ONLY
Bacterial Vaginitis (gardnerella): NEGATIVE
Candida Glabrata: NEGATIVE
Candida Vaginitis: NEGATIVE
Chlamydia: NEGATIVE
Comment: NEGATIVE
Comment: NEGATIVE
Comment: NEGATIVE
Comment: NEGATIVE
Comment: NEGATIVE
Comment: NORMAL
Neisseria Gonorrhea: NEGATIVE
Trichomonas: NEGATIVE

## 2022-08-18 ENCOUNTER — Encounter: Payer: Self-pay | Admitting: Certified Registered Nurse Anesthetist

## 2022-08-19 ENCOUNTER — Ambulatory Visit (AMBULATORY_SURGERY_CENTER): Payer: Federal, State, Local not specified - PPO | Admitting: Gastroenterology

## 2022-08-19 ENCOUNTER — Encounter: Payer: Self-pay | Admitting: Gastroenterology

## 2022-08-19 VITALS — BP 113/60 | HR 76 | Temp 97.1°F | Resp 16 | Ht 63.0 in | Wt 149.0 lb

## 2022-08-19 DIAGNOSIS — K251 Acute gastric ulcer with perforation: Secondary | ICD-10-CM | POA: Diagnosis not present

## 2022-08-19 DIAGNOSIS — K2950 Unspecified chronic gastritis without bleeding: Secondary | ICD-10-CM | POA: Diagnosis not present

## 2022-08-19 DIAGNOSIS — K3189 Other diseases of stomach and duodenum: Secondary | ICD-10-CM | POA: Diagnosis not present

## 2022-08-19 MED ORDER — SODIUM CHLORIDE 0.9 % IV SOLN: 500.0000 mL | INTRAVENOUS | Status: DC

## 2022-08-19 NOTE — Op Note (Signed)
[facility] Patient Name: Madison Mejia Procedure Date: 08/19/2022 1:25 PM MRN: [id number] Endoscopist: Tressia Danas MD, MD, [id number] Age: 37 Referring MD:  Date of Birth: Jan 24, 1986 Gender: Female Account #: [id number] Procedure:                Upper GI endoscopy Indications:              Previously treated for Helicobacter pylori,                            Follow-up of gastric ulcer with perforation treated                            with Peggye Ley 06/24/22 Medicines:                Monitored Anesthesia Care Procedure:                Pre-Anesthesia Assessment:                           - Prior to the procedure, a History and Physical                            was performed, and patient medications and                            allergies were reviewed. The patient's tolerance of                            previous anesthesia was also reviewed. The risks                            and benefits of the procedure and the sedation                            options and risks were discussed with the patient.                            All questions were answered, and informed consent                            was obtained. Prior Anticoagulants: The patient has                            taken no anticoagulant or antiplatelet agents. ASA                            Grade Assessment: II - A patient with mild systemic                            disease. After reviewing the risks and benefits,                            the patient was deemed in satisfactory condition to  undergo the procedure.                           After obtaining informed consent, the endoscope was                            passed under direct vision. Throughout the                            procedure, the patient's blood pressure, pulse, and                            oxygen saturations were monitored continuously. The                            GIF [id number] #[id number] was  introduced through the                            mouth, and advanced to the second part of duodenum.                            The upper GI endoscopy was accomplished without                            difficulty. The patient tolerated the procedure                            well. Scope In: Scope Out: Findings:                 The esophagus was normal.                           Localized mildly erythematous mucosa without                            bleeding was found in the gastric body. In one area                            the mucosa has a nodular appearance. Biopsies were                            taken from the antrum, body, fundus, and in the                            area of most pronounced nodularity with a cold                            forceps for histology. Estimated blood loss was                            minimal. The gastric lumen otherwise appeared                            normal.  A mild post-ulcer deformity was found in the                            duodenal bulb. The mucosa is otherwise normal.                            Biopsies were taken with a cold forceps for                            histology. Estimated blood loss was minimal.                           The cardia and gastric fundus were normal on                            retroflexion. Complications:            No immediate complications. Estimated Blood Loss:     Estimated blood loss was minimal. Impression:               - Normal esophagus.                           - Erythematous mucosa in the gastric body. Biopsied.                           - Duodenal deformity suggesting prior ulcer                            location. Biopsied. Recommendation:           - Patient has a contact number available for                            emergencies. The signs and symptoms of potential                            delayed complications were discussed with the                             patient. Return to normal activities tomorrow.                            Written discharge instructions were provided to the                            patient.                           - Resume previous diet.                           - Continue present medications.                           - No aspirin, ibuprofen, naproxen, or other  non-steroidal anti-inflammatory drugs.                           - Await pathology results. Tressia Danas MD, MD 08/19/2022 2:00:46 PM This report has been signed electronically.

## 2022-08-19 NOTE — Progress Notes (Signed)
Called to room to assist during endoscopic procedure.  Patient ID and intended procedure confirmed with present staff. Received instructions for my participation in the procedure from the performing physician.  

## 2022-08-19 NOTE — Progress Notes (Signed)
1335 Robinul 0.1 mg IV given due large amount of secretions upon assessment.  MD made aware, vss 

## 2022-08-19 NOTE — Patient Instructions (Addendum)
Continue present medications. No aspirin, ibuprofen, naproxen, or other non-steroidal anti-inflammatory drugs. Await pathology results.                                                  YOU HAD AN ENDOSCOPIC PROCEDURE TODAY AT THE [facility]:   Refer to the procedure report that was given to you for any specific questions about what was found during the examination.  If the procedure report does not answer your questions, please call your gastroenterologist to clarify.  If you requested that your care partner not be given the details of your procedure findings, then the procedure report has been included in a sealed envelope for you to review at your convenience later.  YOU SHOULD EXPECT: Some feelings of bloating in the abdomen. Passage of more gas than usual.  Walking can help get rid of the air that was put into your GI tract during the procedure and reduce the bloating. Please Note:  You might notice some irritation and congestion in your nose or some drainage.  This is from the oxygen used during your procedure.  There is no need for concern and it should clear up in a day or so.  SYMPTOMS TO REPORT IMMEDIATELY:  Following upper endoscopy (EGD)  Vomiting of blood or coffee ground material  New chest pain or pain under the shoulder blades  Painful or persistently difficult swallowing  New shortness of breath  Fever of 100F or higher  Black, tarry-looking stools  For urgent or emergent issues, a gastroenterologist can be reached at any hour by calling [phone]. Do not use MyChart messaging for urgent concerns.    DIET:  We do recommend a small meal at first, but then you may proceed to your regular diet.  Drink plenty of fluids but you should avoid alcoholic beverages for 24 hours.  ACTIVITY:  You should plan to take it easy for the rest of today and you should NOT DRIVE or use heavy machinery until tomorrow (because of the sedation medicines used during the test).     FOLLOW UP: Our staff will call the number listed on your records the next business day following your procedure.  We will call around 7:15- 8:00 am to check on you and address any questions or concerns that you may have regarding the information given to you following your procedure. If we do not reach you, we will leave a message.     If any biopsies were taken you will be contacted by phone or by letter within the next 1-3 weeks.  Please call us at [phone] if you have not heard about the biopsies in 3 weeks.    SIGNATURES/CONFIDENTIALITY: You and/or your care partner have signed paperwork which will be entered into your electronic medical record.  These signatures attest to the fact that that the information above on your After Visit Summary has been reviewed and is understood.  Full responsibility of the confidentiality of this discharge information lies with you and/or your care-partner.

## 2022-08-19 NOTE — Progress Notes (Signed)
Referring Provider: Corky Downs, MD Primary Care Physician:  Corky Downs, MD   Indication for EGD:     IMPRESSION:  Perforated gastric ulcer status post exploratory laparotomy with Cheree Ditto patch repair 05/26/2022 H. pylori gastritis treated with bismuth, Flagyl, and tetracycline Appropriate candidate for monitored anesthesia care  PLAN: EGD Colonoscopy in the [facility] today   HPI: Madison Mejia is a 37 y.o. female presents for EGD to follow-up on a gastric ulcer   She was admitted with a perforated gastric ulcer 05/26/2022.  For the 6 months preceding her presentation she had weight loss and ultimately presented with escalating abdominal pain, nausea, and vomiting.  She was taken emergently to the operating room for exploratory laparotomy with Cheree Ditto patch repair of the perforated gastric ulcer 05/26/2022.  An upper GI 05/30/2022 showed no evidence of leak.  Her H. pylori was positive.  She was treated with bismuth, Flagyl, and tetracycline.  There was no significant NSAID history.    She has no ongoing GI symptoms. Completed H pylori treatment without difficulty. Has been gaining weight since the surgery because she feels so much better. Reports 100% adherence with PPI therapy.   There is no known family history of colon cancer or polyps. No family history of stomach cancer or other GI malignancy. No family history of inflammatory bowel disease or celiac.      Past Medical History:  Diagnosis Date   Anemia     Past Surgical History:  Procedure Laterality Date   LAPAROTOMY N/A 05/26/2022   Procedure: EXPLORATORY LAPAROTOMY GRAHAM PATCH REPAIR;  Surgeon: Andria Meuse, MD;  Location: [facility] OR;  Service: General;  Laterality: N/A;   ulcer surgery      Current Outpatient Medications  Medication Sig Dispense Refill   acetaminophen (TYLENOL) 500 MG tablet Take 2 tablets (1,000 mg total) by mouth every 6 (six) hours as needed for mild pain. 30 tablet 0   pantoprazole  (PROTONIX) 40 MG tablet Take 1 tablet (40 mg total) by mouth 2 (two) times daily. 60 tablet 1   Bismuth/Metronidaz/Tetracyclin (PYLERA) 140-125-125 MG CAPS Take 3 capsules by mouth 4 (four) times daily - after meals and at bedtime for 10 days. Take as directed in package 120 capsule 0   oxyCODONE (OXY IR/ROXICODONE) 5 MG immediate release tablet Take 1 tablet (5 mg total) by mouth every 6 (six) hours as needed for severe pain. (Patient not taking: Reported on 07/13/2022) 10 tablet 0   Current Facility-Administered Medications  Medication Dose Route Frequency Provider Last Rate Last Admin   0.9 %  sodium chloride infusion  500 mL Intravenous Continuous Tressia Danas, MD        Allergies as of 08/19/2022 - Review Complete 08/19/2022  Allergen Reaction Noted   Nsaids Anaphylaxis 05/27/2022    Family History  Problem Relation Age of Onset   Colon cancer Neg Hx    Esophageal cancer Neg Hx    Stomach cancer Neg Hx    Colon polyps Neg Hx    Rectal cancer Neg Hx      Physical Exam: General:   Alert,  well-nourished, pleasant and cooperative in NAD Head:  Normocephalic and atraumatic. Eyes:  Sclera clear, no icterus.   Conjunctiva pink. Mouth:  No deformity or lesions.   Neck:  Supple; no masses or thyromegaly. Lungs:  Clear throughout to auscultation.   No wheezes. Heart:  Regular rate and rhythm; no murmurs. Abdomen:  Soft, non-tender, nondistended, normal bowel sounds, no rebound or guarding.  Msk:  Symmetrical. No boney deformities LAD: No inguinal or umbilical LAD Extremities:  No clubbing or edema. Neurologic:  Alert and  oriented x4;  grossly nonfocal Skin:  No obvious rash or bruise. Psych:  Alert and cooperative. Normal mood and affect.     Studies/Results: No results found.    Nannette Zill L. Orvan Falconer, MD, MPH 08/19/2022, 1:32 PM

## 2022-08-22 ENCOUNTER — Telehealth: Payer: Self-pay

## 2022-08-22 NOTE — Telephone Encounter (Signed)
No answer, left message to call if having any issues or concerns, B.Jamiya Nims RN 

## 2022-08-28 ENCOUNTER — Encounter: Payer: Self-pay | Admitting: Gastroenterology

## 2023-01-27 ENCOUNTER — Ambulatory Visit: Payer: Federal, State, Local not specified - PPO | Admitting: Family Medicine

## 2023-03-03 ENCOUNTER — Encounter: Payer: Self-pay | Admitting: Urgent Care

## 2023-03-03 ENCOUNTER — Ambulatory Visit: Payer: Federal, State, Local not specified - PPO | Admitting: Family Medicine

## 2023-03-03 ENCOUNTER — Ambulatory Visit: Payer: Federal, State, Local not specified - PPO | Admitting: Urgent Care

## 2023-03-03 VITALS — BP 101/69 | HR 85 | Temp 97.9°F | Ht 65.0 in | Wt 191.8 lb

## 2023-03-03 DIAGNOSIS — D62 Acute posthemorrhagic anemia: Secondary | ICD-10-CM

## 2023-03-03 DIAGNOSIS — K2980 Duodenitis without bleeding: Secondary | ICD-10-CM

## 2023-03-03 DIAGNOSIS — R635 Abnormal weight gain: Secondary | ICD-10-CM | POA: Insufficient documentation

## 2023-03-03 DIAGNOSIS — Z23 Encounter for immunization: Secondary | ICD-10-CM

## 2023-03-03 LAB — CBC WITH DIFFERENTIAL/PLATELET
Basophils Absolute: 0 10*3/uL (ref 0.0–0.1)
Basophils Relative: 0.6 % (ref 0.0–3.0)
Eosinophils Absolute: 0.1 10*3/uL (ref 0.0–0.7)
Eosinophils Relative: 1.2 % (ref 0.0–5.0)
HCT: 37.2 % (ref 36.0–46.0)
Hemoglobin: 11.7 g/dL — ABNORMAL LOW (ref 12.0–15.0)
Lymphocytes Relative: 31.9 % (ref 12.0–46.0)
Lymphs Abs: 2.1 10*3/uL (ref 0.7–4.0)
MCHC: 31.5 g/dL (ref 30.0–36.0)
MCV: 88 fL (ref 78.0–100.0)
Monocytes Absolute: 0.3 10*3/uL (ref 0.1–1.0)
Monocytes Relative: 4.5 % (ref 3.0–12.0)
Neutro Abs: 4.1 10*3/uL (ref 1.4–7.7)
Neutrophils Relative %: 61.8 % (ref 43.0–77.0)
Platelets: 376 10*3/uL (ref 150.0–400.0)
RBC: 4.22 Mil/uL (ref 3.87–5.11)
RDW: 16.6 % — ABNORMAL HIGH (ref 11.5–15.5)
WBC: 6.6 10*3/uL (ref 4.0–10.5)

## 2023-03-03 LAB — COMPREHENSIVE METABOLIC PANEL
ALT: 18 U/L (ref 0–35)
AST: 17 U/L (ref 0–37)
Albumin: 4.2 g/dL (ref 3.5–5.2)
Alkaline Phosphatase: 62 U/L (ref 39–117)
BUN: 11 mg/dL (ref 6–23)
CO2: 26 meq/L (ref 19–32)
Calcium: 9 mg/dL (ref 8.4–10.5)
Chloride: 107 meq/L (ref 96–112)
Creatinine, Ser: 0.75 mg/dL (ref 0.40–1.20)
GFR: 103.19 mL/min (ref >60.00)
Glucose, Bld: 86 mg/dL (ref 70–99)
Potassium: 4.1 meq/L (ref 3.5–5.1)
Sodium: 142 meq/L (ref 135–145)
Total Bilirubin: 0.4 mg/dL (ref 0.2–1.2)
Total Protein: 6.8 g/dL (ref 6.0–8.3)

## 2023-03-03 LAB — HEMOGLOBIN A1C: Hgb A1c MFr Bld: 5.9 % (ref 4.6–6.5)

## 2023-03-03 MED ORDER — PHENTERMINE HCL 37.5 MG PO TABS: 37.5000 mg | ORAL_TABLET | Freq: Every day | ORAL | 0 refills | Status: DC

## 2023-03-03 MED ORDER — PANTOPRAZOLE SODIUM 40 MG PO TBEC
40.0000 mg | DELAYED_RELEASE_TABLET | Freq: Every day | ORAL | 2 refills | Status: DC
Start: 2023-03-03 — End: 2023-05-02

## 2023-03-03 NOTE — Assessment & Plan Note (Signed)
History of perforated ulcer treated with surgery and quadruple therapy for H. Pylori. No current symptoms. Patient stopped Pantoprazole (Protonix) without recurrence of symptoms. -Recommend restarting Pantoprazole to prevent recurrence of duodenitis. (Still active upon bx in April, pt no longer following with GI) -Discuss need for follow-up with GI specialist in November.

## 2023-03-03 NOTE — Assessment & Plan Note (Addendum)
Weight Gain Rapid weight gain after resolution of GI symptoms. Patient reports increased food intake and attempts at various diets with limited success. No abnormal fatigue or cravings. -Order labs to check CBC, insulin, A1c, and thyroid function. -Encourage portion control and regular physical activity. -Initiate Phentermine for 3 months to jump-start weight loss, monitor blood pressure and pulse rate while on medication. -Consider referral to a dietitian for further dietary counseling. Follow up in 3 weeks to monitor progress and adjust tx plan as needed

## 2023-03-03 NOTE — Assessment & Plan Note (Signed)
Recheck today, last Hgb in January during pt's acute perforation was 9.2. Pt denies sx of iron deficiency anemia.

## 2023-03-03 NOTE — Patient Instructions (Signed)
We obtained blood work today to assess for anemia and rule out metabolic derangements causing weight gain.  I have refilled your pantoprazole.  Please continue to take this daily.  It is best taken 30 minutes before meals in the morning.  As we discussed, please continue to work on healthy lifestyle changes. Excellent work with [employer], please try to get 30 minutes of physical activity 5 days a week. Regarding your diet, continue to "eat the rainbow".  Try to cut out unhealthy starches such as sweets and candies. Try and work on the plate rule that we discussed today -half the plate with nonstarchy veggies, one fourth of the plate with a protein source, one fourth with a healthy complex carbohydrate.  We will start you on phentermine today to help jumpstart your weight loss, however this is a short-term prescription and usually should not be used longer than 3 months. This medication can cause decreased appetite. You may consider cutting the tablet in half and only taking one half tab for the first 5 days to ensure you tolerate it well.  If tolerating, increase to the full tablet. Monitor your blood pressure and pulse rate while on this medication, if blood pressure becomes elevated or you develop palpitations, we will stop the medication.  Please keep your appointment in November with the provider in [place].

## 2023-03-03 NOTE — Progress Notes (Signed)
New Patient Office Visit  Subjective:  Patient ID: Madison Mejia, female    DOB: [birth year]  Age: 37 y.o. MRN: [id number]  CC:  Chief Complaint  Patient presents with   Establish Care    New pt get est.  She had surgery in Jan and has been dealing with weight gain since.    HPI Madison Mejia presents to discuss weight loss. She plans to establish care with PCP in [place].  History of Present Illness  37yo female with a history of a perforated ulcer and subsequent surgery in January 2024, presents for a consultation regarding weight gain. The patient had been experiencing gastrointestinal symptoms for approximately two years prior to the surgery, initially attributing the discomfort to her menstrual cycle. Over time, the symptoms progressively worsened, leading to significant weight loss due to decreased food intake. The patient underwent surgery after a severe episode of pain, during which a perforated ulcer was discovered and treated. Postoperatively, the patient was treated with quadruple therapy for H. Pylori.  Following the surgery, the patient reports a significant improvement in gastrointestinal symptoms, with no recurrence of pain, cramping, nausea, or vomiting. Despite discontinuing the prescribed pantoprazole, the patient has not experienced any recurrent symptoms. However, the patient has noticed a steady weight gain since recovery, increasing from a low of 121 lbs to her current weight of 190 lbs over a period of approximately eight months. This weight gain occurred despite the patient's return to her pre-illness eating habits and an active lifestyle, including regular gym visits and a physically demanding job at the [employer].  The patient has attempted various dietary changes to manage the weight gain, including a meat-free diet, intermittent fasting, and portion control. However, these efforts have only resulted in temporary weight loss, with the patient quickly  regaining the lost weight. The patient reports a newfound craving for carbohydrates, specifically french fries, which was not present prior to the surgery. The patient denies any abnormal fatigue or cravings for non-food items.  The patient's menstrual periods remain regular, with no changes in frequency or volume of bleeding. The patient is not currently on any prescription medications and only occasionally uses over-the-counter Tylenol. The patient has expressed a desire for a sustainable lifestyle change rather than a temporary diet and is open to the possibility of weight loss medications.   Outpatient Encounter Medications as of 03/03/2023  Medication Sig   pantoprazole (PROTONIX) 40 MG tablet Take 1 tablet (40 mg total) by mouth daily.   phentermine (ADIPEX-P) 37.5 MG tablet Take 1 tablet (37.5 mg total) by mouth daily before breakfast.   [DISCONTINUED] acetaminophen (TYLENOL) 500 MG tablet Take 2 tablets (1,000 mg total) by mouth every 6 (six) hours as needed for mild pain.   [DISCONTINUED] Bismuth/Metronidaz/Tetracyclin (PYLERA) 140-125-125 MG CAPS Take 3 capsules by mouth 4 (four) times daily - after meals and at bedtime for 10 days. Take as directed in package   [DISCONTINUED] oxyCODONE (OXY IR/ROXICODONE) 5 MG immediate release tablet Take 1 tablet (5 mg total) by mouth every 6 (six) hours as needed for severe pain. (Patient not taking: Reported on 07/13/2022)   [DISCONTINUED] pantoprazole (PROTONIX) 40 MG tablet Take 1 tablet (40 mg total) by mouth 2 (two) times daily. (Patient not taking: Reported on 03/03/2023)   No facility-administered encounter medications on file as of 03/03/2023.    Past Medical History:  Diagnosis Date   Anemia     Past Surgical History:  Procedure Laterality Date   LAPAROTOMY N/A 05/26/2022  Procedure: EXPLORATORY LAPAROTOMY GRAHAM PATCH REPAIR;  Surgeon: Andria Meuse, MD;  Location: [facility] OR;  Service: General;  Laterality: N/A;   ulcer surgery       Family History  Problem Relation Age of Onset   Colon cancer Neg Hx    Esophageal cancer Neg Hx    Stomach cancer Neg Hx    Colon polyps Neg Hx    Rectal cancer Neg Hx     Social History   Socioeconomic History   Marital status: Single    Spouse name: Not on file   Number of children: 0   Years of education: Not on file   Highest education level: Not on file  Occupational History   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  Tobacco Use   Smoking status: Never   Smokeless tobacco: Never  Vaping Use   Vaping status: Never Used  Substance and Sexual Activity   Alcohol use: Not Currently   Drug use: Never   Sexual activity: Not Currently    Birth control/protection: None  Other Topics Concern   Not on file  Social History Narrative   Not on file   Social Determinants of Health   Financial Resource Strain: Not on file  Food Insecurity: Unknown (05/27/2022)   Hunger Vital Sign    Worried About Running Out of Food in the Last Year: Not on file    Ran Out of Food in the Last Year: Never true  Transportation Needs: No Transportation Needs (05/27/2022)   PRAPARE - Administrator, Civil Service (Medical): No    Lack of Transportation (Non-Medical): No  Physical Activity: Not on file  Stress: Not on file  Social Connections: Not on file  Intimate Partner Violence: Not At Risk (05/27/2022)   Humiliation, Afraid, Rape, and Kick questionnaire    Fear of Current or Ex-Partner: No    Emotionally Abused: No    Physically Abused: No    Sexually Abused: No    ROS: as noted in HPI  Objective:  BP 101/69   Pulse 85   Temp 97.9 F (36.6 C) (Oral)   Ht 5\' 5"  (1.651 m)   Wt 191 lb 12.8 oz (87 kg)   SpO2 99%   BMI 31.92 kg/m   Physical Exam Vitals and nursing note reviewed.  Constitutional:      General: She is not in acute distress.    Appearance: Normal appearance. She is obese. She is not ill-appearing, toxic-appearing or diaphoretic.  HENT:     Head:  Normocephalic and atraumatic.     Right Ear: Tympanic membrane, ear canal and external ear normal. There is no impacted cerumen.     Left Ear: Tympanic membrane, ear canal and external ear normal. There is no impacted cerumen.     Nose: Nose normal.     Mouth/Throat:     Mouth: Mucous membranes are moist.     Pharynx: Oropharynx is clear. No oropharyngeal exudate or posterior oropharyngeal erythema.  Eyes:     General: No scleral icterus.       Right eye: No discharge.        Left eye: No discharge.     Extraocular Movements: Extraocular movements intact.     Pupils: Pupils are equal, round, and reactive to light.  Neck:     Thyroid: No thyroid mass, thyromegaly or thyroid tenderness.  Cardiovascular:     Rate and Rhythm: Normal rate and regular rhythm.     Pulses: Normal pulses.  Heart sounds: No murmur heard. Pulmonary:     Effort: Pulmonary effort is normal. No respiratory distress.     Breath sounds: Normal breath sounds. No stridor. No wheezing or rhonchi.  Abdominal:     General: Abdomen is flat. Bowel sounds are normal. There is no distension.     Palpations: Abdomen is soft.  Musculoskeletal:     Cervical back: Normal range of motion and neck supple. No rigidity or tenderness.     Right lower leg: No edema.     Left lower leg: No edema.  Lymphadenopathy:     Cervical: No cervical adenopathy.  Skin:    General: Skin is warm and dry.     Coloration: Skin is not jaundiced.     Findings: No bruising, erythema or rash.  Neurological:     General: No focal deficit present.     Mental Status: She is alert and oriented to person, place, and time.     Sensory: No sensory deficit.     Motor: No weakness.  Psychiatric:        Mood and Affect: Mood normal.        Behavior: Behavior normal.     Last CBC Lab Results  Component Value Date   WBC 6.6 05/31/2022   HGB 9.2 (L) 05/31/2022   HCT 29.1 (L) 05/31/2022   MCV 79.3 (L) 05/31/2022   MCH 25.1 (L) 05/31/2022   RDW  19.9 (H) 05/31/2022   PLT 364 05/31/2022   Last metabolic panel Lab Results  Component Value Date   GLUCOSE 99 05/31/2022   NA 144 05/31/2022   K 3.5 05/31/2022   CL 111 05/31/2022   CO2 24 05/31/2022   BUN 5 (L) 05/31/2022   CREATININE 0.84 05/31/2022   GFRNONAA >60 05/31/2022   CALCIUM 8.1 (L) 05/31/2022   PHOS 5.3 (H) 05/27/2022   PROT 6.9 05/26/2022   ALBUMIN 3.7 05/26/2022   BILITOT 0.5 05/26/2022   ALKPHOS 41 05/26/2022   AST 24 05/26/2022   ALT 11 05/26/2022   ANIONGAP 9 05/31/2022   Last lipids No results found for: "CHOL", "HDL", "LDLCALC", "LDLDIRECT", "TRIG", "CHOLHDL" Last hemoglobin A1c Lab Results  Component Value Date   HGBA1C 5.9 03/03/2023   Last thyroid functions No results found for: "TSH", "T3TOTAL", "T4TOTAL", "THYROIDAB"    Assessment & Plan:  Weight gain, abnormal Assessment & Plan: Weight Gain Rapid weight gain after resolution of GI symptoms. Patient reports increased food intake and attempts at various diets with limited success. No abnormal fatigue or cravings. -Order labs to check CBC, insulin, A1c, and thyroid function. -Encourage portion control and regular physical activity. -Initiate Phentermine for 3 months to jump-start weight loss, monitor blood pressure and pulse rate while on medication. -Consider referral to a dietitian for further dietary counseling. Follow up in 3 weeks to monitor progress and adjust tx plan as needed  Orders: -     Comprehensive metabolic panel -     CBC with Differential/Platelet -     Iron, TIBC and Ferritin Panel -     TSH Rfx on Abnormal to Free T4 -     Insulin, Free (Bioactive) -     Hemoglobin A1c -     Phentermine HCl; Take 1 tablet (37.5 mg total) by mouth daily before breakfast.  Dispense: 30 tablet; Refill: 0  Anemia due to acute blood loss Assessment & Plan: Recheck today, last Hgb in January during pt's acute perforation was 9.2. Pt denies sx of iron  deficiency anemia.  Orders: -     CBC  with Differential/Platelet -     Iron, TIBC and Ferritin Panel  Duodenitis Assessment & Plan: History of perforated ulcer treated with surgery and quadruple therapy for H. Pylori. No current symptoms. Patient stopped Pantoprazole (Protonix) without recurrence of symptoms. -Recommend restarting Pantoprazole to prevent recurrence of duodenitis. (Still active upon bx in April, pt no longer following with GI) -Discuss need for follow-up with GI specialist in November.  Orders: -     Pantoprazole Sodium; Take 1 tablet (40 mg total) by mouth daily.  Dispense: 90 tablet; Refill: 2    Keep appointment with new PCP in Nov. All future refills will come from [place].   Maretta Bees, PA

## 2023-03-04 LAB — IRON,TIBC AND FERRITIN PANEL
%SAT: 10 % — ABNORMAL LOW (ref 16–45)
Ferritin: 17 ng/mL (ref 16–154)
Iron: 41 ug/dL (ref 40–190)
TIBC: 407 ug/dL (ref 250–450)

## 2023-03-07 LAB — TSH RFX ON ABNORMAL TO FREE T4: TSH: 0.987 u[IU]/mL (ref 0.450–4.500)

## 2023-03-19 LAB — INSULIN, FREE (BIOACTIVE): Insulin, Free: 5.3 u[IU]/mL (ref 1.5–14.9)

## 2023-03-28 ENCOUNTER — Ambulatory Visit: Payer: Federal, State, Local not specified - PPO | Admitting: Family Medicine

## 2023-04-26 ENCOUNTER — Other Ambulatory Visit: Payer: Self-pay | Admitting: Urgent Care

## 2023-04-26 ENCOUNTER — Encounter: Payer: Self-pay | Admitting: Urgent Care

## 2023-04-26 DIAGNOSIS — R635 Abnormal weight gain: Secondary | ICD-10-CM

## 2023-04-27 ENCOUNTER — Telehealth: Payer: Self-pay | Admitting: Family Medicine

## 2023-04-27 ENCOUNTER — Encounter: Payer: Self-pay | Admitting: Family Medicine

## 2023-04-27 NOTE — Telephone Encounter (Signed)
Patient schedule herself on 05/02/23, that day is [facility] only. Voice box full, sent MyChart letter that appointment has been canceled and needs to reschedule. Dr Claris Che New Patient's are out to the end of March beginning of April at this time.

## 2023-05-02 ENCOUNTER — Ambulatory Visit: Payer: Federal, State, Local not specified - PPO | Admitting: Urgent Care

## 2023-05-02 ENCOUNTER — Ambulatory Visit: Payer: Federal, State, Local not specified - PPO | Admitting: Family Medicine

## 2023-05-02 ENCOUNTER — Encounter: Payer: Self-pay | Admitting: Urgent Care

## 2023-05-02 VITALS — BP 109/75 | HR 89 | Ht 65.0 in | Wt 186.0 lb

## 2023-05-02 DIAGNOSIS — R635 Abnormal weight gain: Secondary | ICD-10-CM

## 2023-05-02 DIAGNOSIS — D62 Acute posthemorrhagic anemia: Secondary | ICD-10-CM

## 2023-05-02 MED ORDER — PHENTERMINE HCL 30 MG PO CAPS: 30.0000 mg | ORAL_CAPSULE | ORAL | 0 refills | Status: DC

## 2023-05-02 NOTE — Assessment & Plan Note (Signed)
Stop 37.5mg , change to 30mg  to see if this stops the "jittery" feeling. Take daily with plenty of water. Monitor pulse rate and BP Return in one month for recheck. Short term use recommended - no more than 6 mo

## 2023-05-02 NOTE — Assessment & Plan Note (Signed)
Due to prior GI issues - recent Hgb 11.7. Recheck in Jan

## 2023-05-02 NOTE — Patient Instructions (Signed)
Restart phentermine, but at the lower dose. Take 30mg  capsule once daily.  If not covered by insurance, use GoodRx.com to get you a coupon, it should not be more than $12-19 per month  Monitor for any adverse side effects, notify me if this occurs.  Return for recheck in one month, will need repeat CBC at that time.

## 2023-05-02 NOTE — Progress Notes (Signed)
Established Patient Office Visit  Subjective:  Patient ID: Madison Mejia, female    DOB: 03-04-1986  Age: 37 y.o. MRN: [id number]  Chief Complaint  Patient presents with   Medication Refill    Pt is not est care. She just needs refills on Phentermine.    Pt presents primarily for phentermine refill. She was prescribed this originally on 03/03/23. She reports having taken it for a week or so initially, but discontinued it due to "feeling weird" on it. She admits to having had increased snacking and stress eating primarily due to long hours at work for the holidays (works at [employer]) and recalls having a decreased appetite for the trial week. She started taking it again about 3 weeks ago. She states she tolerated it better the second time, but still felt "jittery." She denied elevated blood pressure, palpitations, GI sx or shaking. She did have some trouble falling asleep if Rx taken too late in the day, but admits this helped her cope with her longer hours at work. She lost about 10# according to her scale, 5 according to ours. She denies any additional concerns today.  Medication Refill    Patient Active Problem List   Diagnosis Date Noted   Weight gain, abnormal 03/03/2023   Anemia due to acute blood loss 03/03/2023   Duodenitis 03/03/2023   Gastric perforation, acute 05/27/2022   Dysmenorrhea 12/15/2020   Past Medical History:  Diagnosis Date   Anemia    Social History   Tobacco Use   Smoking status: Never   Smokeless tobacco: Never  Vaping Use   Vaping status: Never Used  Substance Use Topics   Alcohol use: Not Currently   Drug use: Never      ROS: as noted in HPI  Objective:     BP 109/75   Pulse 89   Ht 5\' 5"  (1.651 m)   Wt 186 lb (84.4 kg)   SpO2 97%   BMI 30.95 kg/m  BP Readings from Last 3 Encounters:  05/02/23 109/75  03/03/23 101/69  08/19/22 113/60   Wt Readings from Last 3 Encounters:  05/02/23 186 lb (84.4 kg)  03/03/23 191 lb 12.8 oz (87  kg)  08/19/22 149 lb (67.6 kg)      Physical Exam Vitals and nursing note reviewed.  Constitutional:      General: She is not in acute distress.    Appearance: Normal appearance. She is not ill-appearing, toxic-appearing or diaphoretic.  HENT:     Head: Normocephalic and atraumatic.  Eyes:     General: No scleral icterus.       Right eye: No discharge.        Left eye: No discharge.     Extraocular Movements: Extraocular movements intact.     Pupils: Pupils are equal, round, and reactive to light.  Cardiovascular:     Rate and Rhythm: Normal rate and regular rhythm.  Pulmonary:     Effort: Pulmonary effort is normal. No respiratory distress.     Breath sounds: No stridor. No wheezing, rhonchi or rales.  Musculoskeletal:     Cervical back: Normal range of motion and neck supple. No rigidity or tenderness.  Lymphadenopathy:     Cervical: No cervical adenopathy.  Skin:    General: Skin is warm and dry.     Coloration: Skin is not jaundiced.     Findings: No bruising, erythema or rash.  Neurological:     General: No focal deficit present.  Mental Status: She is alert and oriented to person, place, and time.     Gait: Gait normal.  Psychiatric:        Mood and Affect: Mood normal.        Behavior: Behavior normal.      No results found for any visits on 05/02/23.  Last CBC Lab Results  Component Value Date   WBC 6.6 03/03/2023   HGB 11.7 (L) 03/03/2023   HCT 37.2 03/03/2023   MCV 88.0 03/03/2023   MCH 25.1 (L) 05/31/2022   RDW 16.6 (H) 03/03/2023   PLT 376.0 03/03/2023   Last metabolic panel Lab Results  Component Value Date   GLUCOSE 86 03/03/2023   NA 142 03/03/2023   K 4.1 03/03/2023   CL 107 03/03/2023   CO2 26 03/03/2023   BUN 11 03/03/2023   CREATININE 0.75 03/03/2023   GFR 103.19 03/03/2023   CALCIUM 9.0 03/03/2023   PHOS 5.3 (H) 05/27/2022   PROT 6.8 03/03/2023   ALBUMIN 4.2 03/03/2023   BILITOT 0.4 03/03/2023   ALKPHOS 62 03/03/2023   AST  17 03/03/2023   ALT 18 03/03/2023   ANIONGAP 9 05/31/2022   Last lipids No results found for: "CHOL", "HDL", "LDLCALC", "LDLDIRECT", "TRIG", "CHOLHDL" Last hemoglobin A1c Lab Results  Component Value Date   HGBA1C 5.9 03/03/2023   Last thyroid functions Lab Results  Component Value Date   TSH 0.987 03/03/2023      The ASCVD Risk score (Arnett DK, et al., 2019) failed to calculate for the following reasons:   The 2019 ASCVD risk score is only valid for ages 67 to 71  Assessment & Plan:  Anemia due to acute blood loss Assessment & Plan: Due to prior GI issues - recent Hgb 11.7. Recheck in Jan   Weight gain, abnormal Assessment & Plan: Stop 37.5mg , change to 30mg  to see if this stops the "jittery" feeling. Take daily with plenty of water. Monitor pulse rate and BP Return in one month for recheck. Short term use recommended - no more than 6 mo  Orders: -     Phentermine HCl; Take 1 capsule (30 mg total) by mouth every morning.  Dispense: 30 capsule; Refill: 0     Return in about 4 weeks (around 05/30/2023).   Maretta Bees, PA

## 2023-10-16 ENCOUNTER — Other Ambulatory Visit (HOSPITAL_COMMUNITY): Payer: Self-pay

## 2023-10-16 ENCOUNTER — Encounter: Payer: Self-pay | Admitting: Nurse Practitioner

## 2023-10-16 ENCOUNTER — Ambulatory Visit: Admitting: Nurse Practitioner

## 2023-10-16 ENCOUNTER — Telehealth: Payer: Self-pay

## 2023-10-16 VITALS — BP 122/70 | HR 100 | Temp 97.9°F | Resp 18 | Ht 64.0 in | Wt 179.9 lb

## 2023-10-16 DIAGNOSIS — E6609 Other obesity due to excess calories: Secondary | ICD-10-CM | POA: Insufficient documentation

## 2023-10-16 DIAGNOSIS — E66811 Obesity, class 1: Secondary | ICD-10-CM

## 2023-10-16 DIAGNOSIS — Z130 Encounter for screening for diseases of the blood and blood-forming organs and certain disorders involving the immune mechanism: Secondary | ICD-10-CM

## 2023-10-16 DIAGNOSIS — Z131 Encounter for screening for diabetes mellitus: Secondary | ICD-10-CM

## 2023-10-16 DIAGNOSIS — Z7689 Persons encountering health services in other specified circumstances: Secondary | ICD-10-CM

## 2023-10-16 DIAGNOSIS — Z13220 Encounter for screening for lipoid disorders: Secondary | ICD-10-CM

## 2023-10-16 DIAGNOSIS — Z6830 Body mass index (BMI) 30.0-30.9, adult: Secondary | ICD-10-CM

## 2023-10-16 MED ORDER — ZEPBOUND 2.5 MG/0.5ML ~~LOC~~ SOAJ: 2.5000 mg | SUBCUTANEOUS | 0 refills | Status: DC

## 2023-10-16 NOTE — Progress Notes (Signed)
 BP 122/70   Pulse 100   Temp 97.9 F (36.6 C)   Resp 18   Ht 5\' 4"  (1.626 m)   Wt 179 lb 14.4 oz (81.6 kg)   LMP 10/12/2023   SpO2 97%   BMI 30.88 kg/m    Subjective:    Patient ID: Madison Mejia, female    DOB: 1985-09-20, 38 y.o.   MRN: [id number]  HPI: Madison Mejia is a 38 y.o. female  Chief Complaint  Patient presents with   Establish Care   Obesity    Wants to see about getting on weight loss meds    Discussed the use of AI scribe software for clinical note transcription with the patient, who gave verbal consent to proceed.  History of Present Illness Madison Mejia is a 38 year old female who presents for weight management.  She currently weighs 179 pounds, with a BMI of 30.88, and wants to lose approximately 30 pounds. This is the heaviest she has ever been. Prior to her surgery for a stomach ulcer in January of the previous year, she weighed around 120 pounds due to difficulty eating. Her usual weight before the ulcer was between 130 and 145 pounds. Since recovering from the surgery, she has gained nearly 40 pounds over the past year, which she finds stressful and challenging to manage.  She has a past medical history of a stomach ulcer, for which she underwent surgery in January of the previous year. The ulcer has healed, and she has not had further issues with it.  In terms of family history, she is not close to her mother and is unaware of any significant medical conditions on that side of the family. On her father's side, she mentions an uncle who was on dialysis for several years before passing away, although she is unsure of the exact cause of death.  No personal history of pancreatitis or family history of thyroid  cancer.         10/16/2023   10:17 AM 03/03/2023   11:29 AM 07/13/2021    9:27 AM  Depression screen PHQ 2/9  Decreased Interest 0 0 0  Down, Depressed, Hopeless 0 0 0  PHQ - 2 Score 0 0 0  Altered sleeping 0 0 0  Tired, decreased  energy 0 2 0  Change in appetite 0 1 0  Feeling bad or failure about yourself  0 0 0  Trouble concentrating 0 0 0  Moving slowly or fidgety/restless 0 0 0  Suicidal thoughts 0 0 0  PHQ-9 Score 0 3 0  Difficult doing work/chores Not difficult at all Not difficult at all Not difficult at all    Relevant past medical, surgical, family and social history reviewed and updated as indicated. Interim medical history since our last visit reviewed. Allergies and medications reviewed and updated.  Review of Systems  Constitutional: Negative for fever or weight change.  Respiratory: Negative for cough and shortness of breath.   Cardiovascular: Negative for chest pain or palpitations.  Gastrointestinal: Negative for abdominal pain, no bowel changes.  Musculoskeletal: Negative for gait problem or joint swelling.  Skin: Negative for rash.  Neurological: Negative for dizziness or headache.  No other specific complaints in a complete review of systems (except as listed in HPI above).      Objective:      BP 122/70   Pulse 100   Temp 97.9 F (36.6 C)   Resp 18   Ht 5\' 4"  (1.626 m)  Wt 179 lb 14.4 oz (81.6 kg)   LMP 10/12/2023   SpO2 97%   BMI 30.88 kg/m    Wt Readings from Last 3 Encounters:  10/16/23 179 lb 14.4 oz (81.6 kg)  05/02/23 186 lb (84.4 kg)  03/03/23 191 lb 12.8 oz (87 kg)    Physical Exam Vitals reviewed.  Constitutional:      Appearance: Normal appearance.  HENT:     Head: Normocephalic.  Cardiovascular:     Rate and Rhythm: Normal rate and regular rhythm.  Pulmonary:     Effort: Pulmonary effort is normal.     Breath sounds: Normal breath sounds.  Musculoskeletal:        General: Normal range of motion.  Skin:    General: Skin is warm and dry.  Neurological:     General: No focal deficit present.     Mental Status: She is alert and oriented to person, place, and time. Mental status is at baseline.  Psychiatric:        Mood and Affect: Mood normal.         Behavior: Behavior normal.        Thought Content: Thought content normal.        Judgment: Judgment normal.        Results for orders placed or performed in visit on 03/03/23  CMP   Collection Time: 03/03/23 11:41 AM  Result Value Ref Range   Sodium 142 135 - 145 mEq/L   Potassium 4.1 3.5 - 5.1 mEq/L   Chloride 107 96 - 112 mEq/L   CO2 26 19 - 32 mEq/L   Glucose, Bld 86 70 - 99 mg/dL   BUN 11 6 - 23 mg/dL   Creatinine, Ser 6.21 0.40 - 1.20 mg/dL   Total Bilirubin 0.4 0.2 - 1.2 mg/dL   Alkaline Phosphatase 62 39 - 117 U/L   AST 17 0 - 37 U/L   ALT 18 0 - 35 U/L   Total Protein 6.8 6.0 - 8.3 g/dL   Albumin 4.2 3.5 - 5.2 g/dL   GFR 308.65 >78.46 mL/min   Calcium 9.0 8.4 - 10.5 mg/dL  CBC w/Diff   Collection Time: 03/03/23 11:41 AM  Result Value Ref Range   WBC 6.6 4.0 - 10.5 K/uL   RBC 4.22 3.87 - 5.11 Mil/uL   Hemoglobin 11.7 (L) 12.0 - 15.0 g/dL   HCT 96.2 95.2 - 84.1 %   MCV 88.0 78.0 - 100.0 fl   MCHC 31.5 30.0 - 36.0 g/dL   RDW 32.4 (H) 40.1 - 02.7 %   Platelets 376.0 150.0 - 400.0 K/uL   Neutrophils Relative % 61.8 43.0 - 77.0 %   Lymphocytes Relative 31.9 12.0 - 46.0 %   Monocytes Relative 4.5 3.0 - 12.0 %   Eosinophils Relative 1.2 0.0 - 5.0 %   Basophils Relative 0.6 0.0 - 3.0 %   Neutro Abs 4.1 1.4 - 7.7 K/uL   Lymphs Abs 2.1 0.7 - 4.0 K/uL   Monocytes Absolute 0.3 0.1 - 1.0 K/uL   Eosinophils Absolute 0.1 0.0 - 0.7 K/uL   Basophils Absolute 0.0 0.0 - 0.1 K/uL  Iron, TIBC and Ferritin Panel   Collection Time: 03/03/23 11:41 AM  Result Value Ref Range   Iron 41 40 - 190 mcg/dL   TIBC 253 664 - 403 mcg/dL (calc)   %SAT 10 (L) 16 - 45 % (calc)   Ferritin 17 16 - 154 ng/mL  TSH Rfx on Abnormal to Free  T4   Collection Time: 03/03/23 11:41 AM  Result Value Ref Range   TSH 0.987 0.450 - 4.500 uIU/mL  Insulin , Free (Bioactive)-([facility])   Collection Time: 03/03/23 11:46 AM  Result Value Ref Range   Insulin , Free 5.3 1.5 - 14.9 uIU/mL  Hemoglobin A1c    Collection Time: 03/03/23 11:46 AM  Result Value Ref Range   Hgb A1c MFr Bld 5.9 4.6 - 6.5 %          Assessment & Plan:   Problem List Items Addressed This Visit       Other   Class 1 obesity due to excess calories without serious comorbidity with body mass index (BMI) of 30.0 to 30.9 in adult - Primary   Relevant Medications   tirzepatide (ZEPBOUND) 2.5 MG/0.5ML Pen   Other Relevant Orders   TSH   Other Visit Diagnoses       Encounter to establish care         Screening for cholesterol level       Relevant Orders   Lipid panel     Screening for diabetes mellitus       Relevant Orders   Comprehensive metabolic panel with GFR   Hemoglobin A1c     Screening for deficiency anemia       Relevant Orders   CBC with Differential/Platelet        Assessment and Plan Assessment & Plan Obesity (Class I) BMI is 30.88, indicating class I obesity. She aims to lose 30 pounds after gaining approximately 40 pounds post-recovery from stomach ulcer surgery, attributed to increased food intake. Zepbound, a GLP-1 receptor agonist, was discussed for weight loss. She has no personal history of pancreatitis or family history of thyroid  cancer, making her eligible for Zepbound. Potential side effects include abdominal pain, constipation, fatigue, nausea, and vomiting. Zepbound is a weekly injection with variable bioavailability depending on the injection site. It is emphasized as an adjunct to a calorie deficit and physical activity, not a standalone solution. If insurance denies Zepbound, [place] will be considered. - Submit prior authorization for Zepbound to insurance. - Consider Wegovy if Zepbound is not approved. - Order blood work to assess current health status. - Schedule follow-up in 3 months to evaluate weight loss progress and insurance compliance. - Encourage continuation of lifestyle modifications, including dietary management and regular exercise. -continue to increase physical  activity, getting at least 150 min of physical activity a week.  Work on including [employer], broadcasting/film/video 2 days a week.  - continue eating at a calorie deficit 1600-1700 cal a day, eating a well balanced diet with whole foods, avoiding processed foods.     Resolved Stomach Ulcer She underwent surgery for a stomach ulcer in January of the previous year. The ulcer has healed completely, and she is currently asymptomatic. The ulcer previously affected her ability to eat, leading to significant weight loss at that time.        Follow up plan: Return in about 3 months (around 01/16/2024) for follow up-weight management.

## 2023-10-16 NOTE — Telephone Encounter (Signed)
 Pharmacy Patient Advocate Encounter   Received notification from CoverMyMeds that prior authorization for Zepbound 2.5MG /0.5ML pen-injectors  is required/requested.   Insurance verification completed.   The patient is insured through [facility] .   Per test claim: PA required; PA submitted to above mentioned insurance via CoverMyMeds Key/confirmation #/EOC [id number] Status is pending

## 2023-10-17 ENCOUNTER — Other Ambulatory Visit (HOSPITAL_COMMUNITY): Payer: Self-pay

## 2023-10-17 ENCOUNTER — Ambulatory Visit: Payer: Self-pay | Admitting: Nurse Practitioner

## 2023-10-17 LAB — CBC WITH DIFFERENTIAL/PLATELET
Absolute Lymphocytes: 2563 {cells}/uL (ref 850–3900)
Absolute Monocytes: 367 {cells}/uL (ref 200–950)
Basophils Absolute: 50 {cells}/uL (ref 0–200)
Basophils Relative: 0.7 %
Eosinophils Absolute: 108 {cells}/uL (ref 15–500)
Eosinophils Relative: 1.5 %
HCT: 38.6 % (ref 35.0–45.0)
Hemoglobin: 12.6 g/dL (ref 11.7–15.5)
MCH: 29.6 pg (ref 27.0–33.0)
MCHC: 32.6 g/dL (ref 32.0–36.0)
MCV: 90.6 fL (ref 80.0–100.0)
MPV: 10.1 fL (ref 7.5–12.5)
Monocytes Relative: 5.1 %
Neutro Abs: 4111 {cells}/uL (ref 1500–7800)
Neutrophils Relative %: 57.1 %
Platelets: 395 10*3/uL (ref 140–400)
RBC: 4.26 10*6/uL (ref 3.80–5.10)
RDW: 14.1 % (ref 11.0–15.0)
Total Lymphocyte: 35.6 %
WBC: 7.2 10*3/uL (ref 3.8–10.8)

## 2023-10-17 LAB — COMPREHENSIVE METABOLIC PANEL WITH GFR
AG Ratio: 1.8 (calc) (ref 1.0–2.5)
ALT: 13 U/L (ref 6–29)
AST: 13 U/L (ref 10–30)
Albumin: 4.4 g/dL (ref 3.6–5.1)
Alkaline phosphatase (APISO): 59 U/L (ref 31–125)
BUN: 11 mg/dL (ref 7–25)
CO2: 24 mmol/L (ref 20–32)
Calcium: 9.4 mg/dL (ref 8.6–10.2)
Chloride: 109 mmol/L (ref 98–110)
Creat: 0.86 mg/dL (ref 0.50–0.97)
Globulin: 2.5 g/dL (ref 1.9–3.7)
Glucose, Bld: 87 mg/dL (ref 65–99)
Potassium: 4.6 mmol/L (ref 3.5–5.3)
Sodium: 143 mmol/L (ref 135–146)
Total Bilirubin: 0.3 mg/dL (ref 0.2–1.2)
Total Protein: 6.9 g/dL (ref 6.1–8.1)
eGFR: 90 mL/min/{1.73_m2} (ref >=60)

## 2023-10-17 LAB — TSH: TSH: 1.61 m[IU]/L

## 2023-10-17 LAB — LIPID PANEL
Cholesterol: 210 mg/dL — ABNORMAL HIGH (ref <200)
HDL: 44 mg/dL — ABNORMAL LOW (ref >=50)
LDL Cholesterol (Calc): 144 mg/dL — ABNORMAL HIGH
Non-HDL Cholesterol (Calc): 166 mg/dL — ABNORMAL HIGH (ref <130)
Total CHOL/HDL Ratio: 4.8 (calc) (ref <5.0)
Triglycerides: 105 mg/dL (ref <150)

## 2023-10-17 LAB — HEMOGLOBIN A1C
Hgb A1c MFr Bld: 5.6 % (ref <5.7)
Mean Plasma Glucose: 114 mg/dL
eAG (mmol/L): 6.3 mmol/L

## 2023-10-17 NOTE — Telephone Encounter (Signed)
 Pharmacy Patient Advocate Encounter  Received notification from [facility] that Prior Authorization for Zepbound 2.5MG /0.5ML pen-injectors  has been DENIED.  Full denial letter will be uploaded to the media tab. See denial reason below.     PA #/Case ID/Reference #: [id number]

## 2023-10-22 ENCOUNTER — Other Ambulatory Visit: Payer: Self-pay | Admitting: Nurse Practitioner

## 2023-10-22 DIAGNOSIS — E6609 Other obesity due to excess calories: Secondary | ICD-10-CM

## 2023-10-22 MED ORDER — WEGOVY 0.25 MG/0.5ML ~~LOC~~ SOAJ: 0.2500 mg | SUBCUTANEOUS | 0 refills | Status: DC

## 2023-10-24 ENCOUNTER — Other Ambulatory Visit: Payer: Self-pay | Admitting: Nurse Practitioner

## 2023-10-24 DIAGNOSIS — E6609 Other obesity due to excess calories: Secondary | ICD-10-CM

## 2023-10-24 MED ORDER — PHENTERMINE-TOPIRAMATE ER 3.75-23 MG PO CP24: 1.0000 | ORAL_CAPSULE | Freq: Every morning | ORAL | 0 refills | Status: DC

## 2023-10-25 ENCOUNTER — Other Ambulatory Visit: Payer: Self-pay | Admitting: Nurse Practitioner

## 2023-10-25 ENCOUNTER — Telehealth: Payer: Self-pay

## 2023-10-25 ENCOUNTER — Other Ambulatory Visit (HOSPITAL_COMMUNITY): Payer: Self-pay

## 2023-10-25 ENCOUNTER — Other Ambulatory Visit: Payer: Self-pay

## 2023-10-25 DIAGNOSIS — E6609 Other obesity due to excess calories: Secondary | ICD-10-CM

## 2023-10-25 MED ORDER — PHENTERMINE-TOPIRAMATE ER 3.75-23 MG PO CP24: 1.0000 | ORAL_CAPSULE | Freq: Every morning | ORAL | 0 refills | Status: DC

## 2023-10-25 NOTE — Telephone Encounter (Signed)
 Pharmacy Patient Advocate Encounter   Received notification from [employer] that prior authorization for Phentermine -Topiramate ER 3.75-23MG  er capsules  is required/requested.   Insurance verification completed.   The patient is insured through [facility] .   Per test claim: PA required; PA submitted to above mentioned insurance via CoverMyMeds Key/confirmation #/EOC [id number] Status is pending

## 2023-10-26 DIAGNOSIS — F4321 Adjustment disorder with depressed mood: Secondary | ICD-10-CM | POA: Diagnosis not present

## 2023-10-26 NOTE — Telephone Encounter (Signed)
 Pharmacy Patient Advocate Encounter  Received notification from [facility] that Prior Authorization for Phentermine -Topiramate ER 3.75-23MG  er capsules  has been DENIED.  See denial reason below. No denial letter attached in CMM. Will attach denial letter to Media tab once received.   PA #/Case ID/Reference #: [id number]

## 2023-10-30 ENCOUNTER — Other Ambulatory Visit (HOSPITAL_COMMUNITY): Payer: Self-pay

## 2023-10-30 NOTE — Telephone Encounter (Signed)
 Madison Mejia please see the denial letter that was uploaded to pt's media tab. It looks like she has to participate in a comprehensive weight management program mandatory

## 2023-10-31 ENCOUNTER — Other Ambulatory Visit: Payer: Self-pay | Admitting: Nurse Practitioner

## 2023-10-31 DIAGNOSIS — E66811 Obesity, class 1: Secondary | ICD-10-CM

## 2023-10-31 MED ORDER — PHENTERMINE HCL 37.5 MG PO TABS: ORAL_TABLET | ORAL | 1 refills | Status: AC

## 2023-11-02 ENCOUNTER — Other Ambulatory Visit (HOSPITAL_COMMUNITY): Payer: Self-pay

## 2023-11-09 DIAGNOSIS — F4321 Adjustment disorder with depressed mood: Secondary | ICD-10-CM | POA: Diagnosis not present

## 2023-11-15 ENCOUNTER — Other Ambulatory Visit (HOSPITAL_COMMUNITY): Payer: Self-pay

## 2023-11-23 DIAGNOSIS — F4320 Adjustment disorder, unspecified: Secondary | ICD-10-CM | POA: Diagnosis not present

## 2023-11-30 DIAGNOSIS — F4320 Adjustment disorder, unspecified: Secondary | ICD-10-CM | POA: Diagnosis not present

## 2023-12-21 DIAGNOSIS — F4320 Adjustment disorder, unspecified: Secondary | ICD-10-CM | POA: Diagnosis not present

## 2023-12-22 IMAGING — US US PELVIS COMPLETE WITH TRANSVAGINAL
2 series · 13 of 25 positions shown · non-contrast
Comparison: None

CLINICAL DATA: Dysmenorrhea, pelvic pain for 2 months, LMP
07/14/2021, G0

EXAM:
TRANSABDOMINAL AND TRANSVAGINAL ULTRASOUND OF PELVIS
TECHNIQUE: Both transabdominal and transvaginal ultrasound examinations of the
pelvis were performed. Transabdominal technique was performed for
global imaging of the pelvis including uterus, ovaries, adnexal
regions, and pelvic cul-de-sac. It was necessary to proceed with
endovaginal exam following the transabdominal exam to visualize the
endometrium and RIGHT ovary.

[Series 1: us pelvis complete with transvaginal · 0.22mm/px · 12 of 81 slices shown (1 of 2)]
[im 1/81]
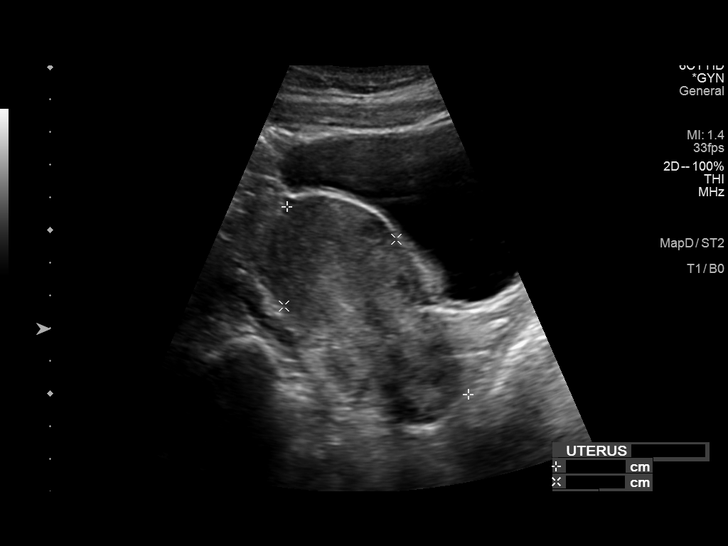
[im 7/81]
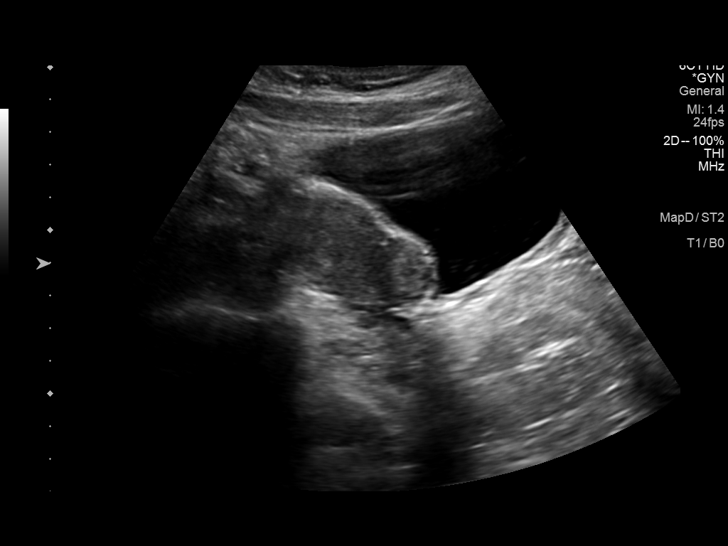
[im 14/81]
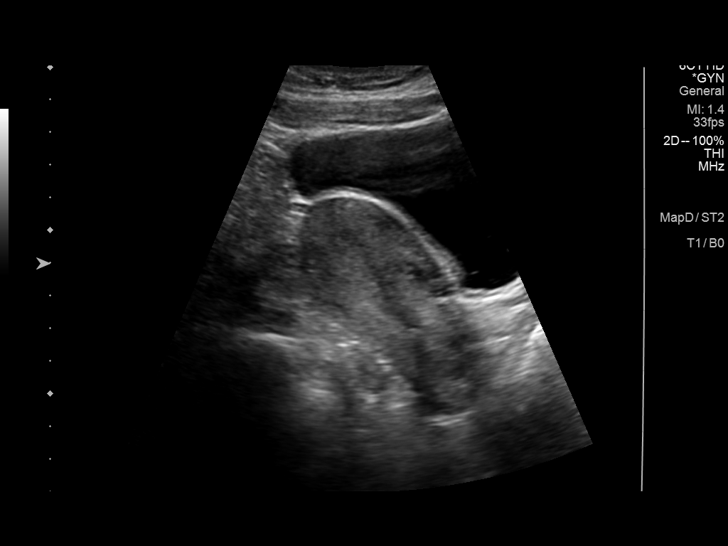
[im 21/81]
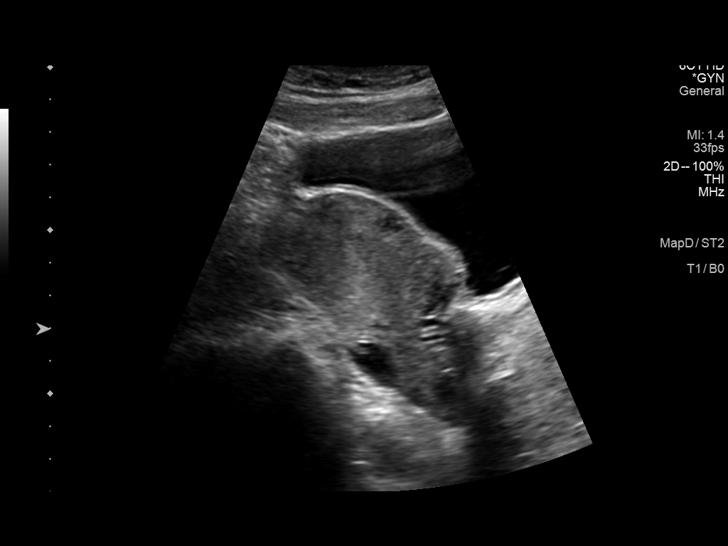
[im 28/81]
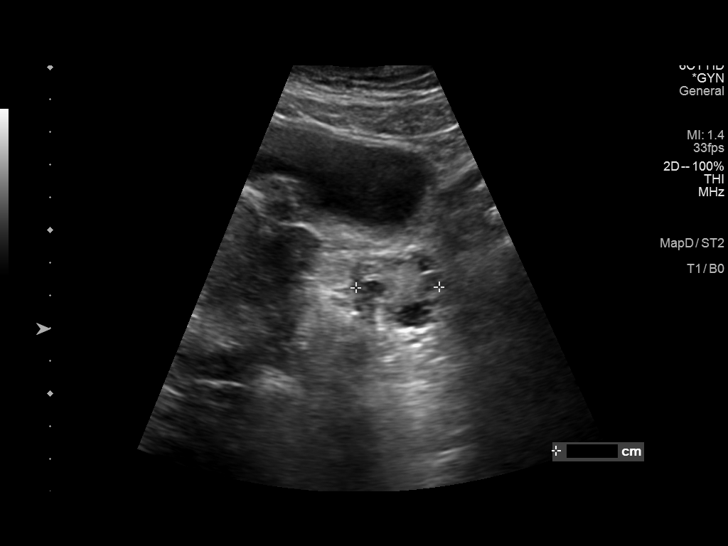
[im 35/81]
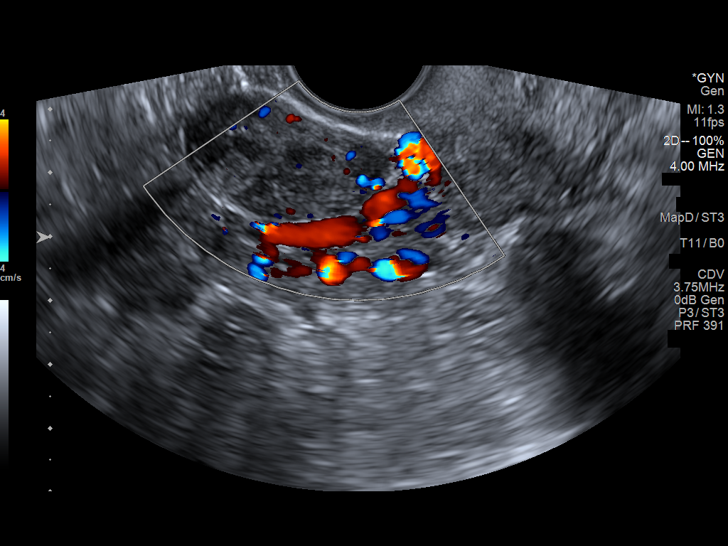
[im 42/81]
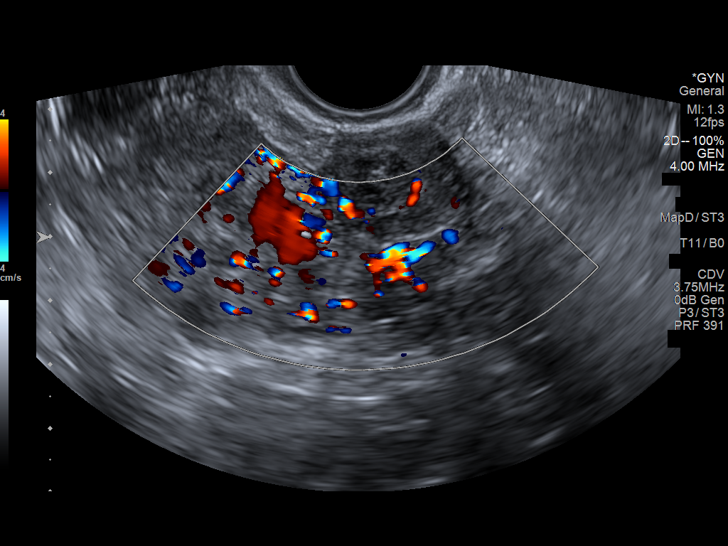
[im 49/81]
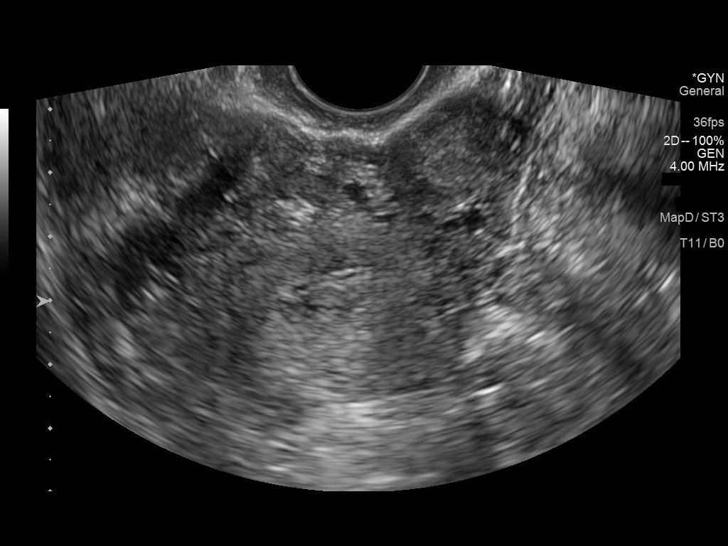
[im 56/81]
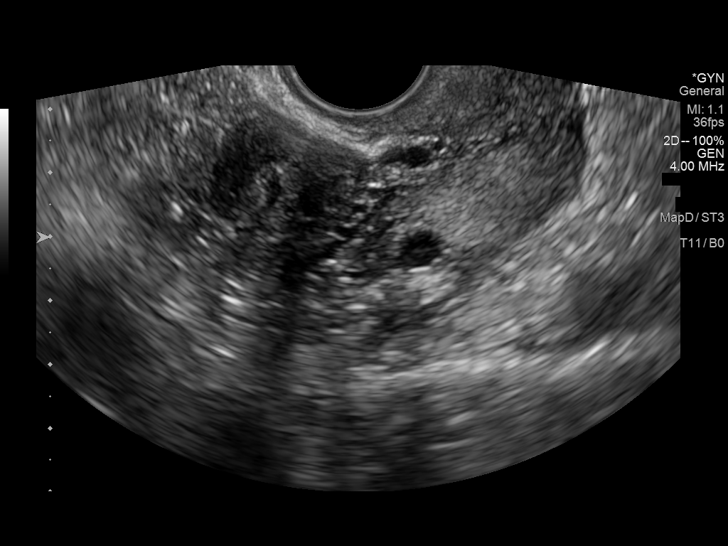
[im 63/81]
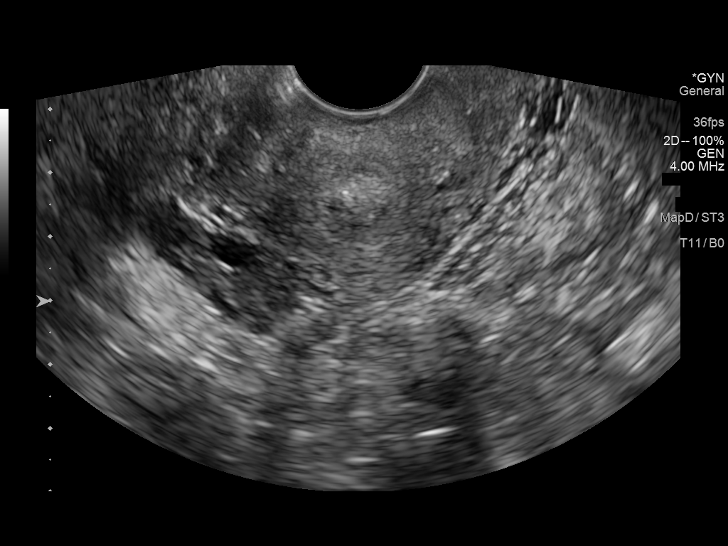
[im 70/81]
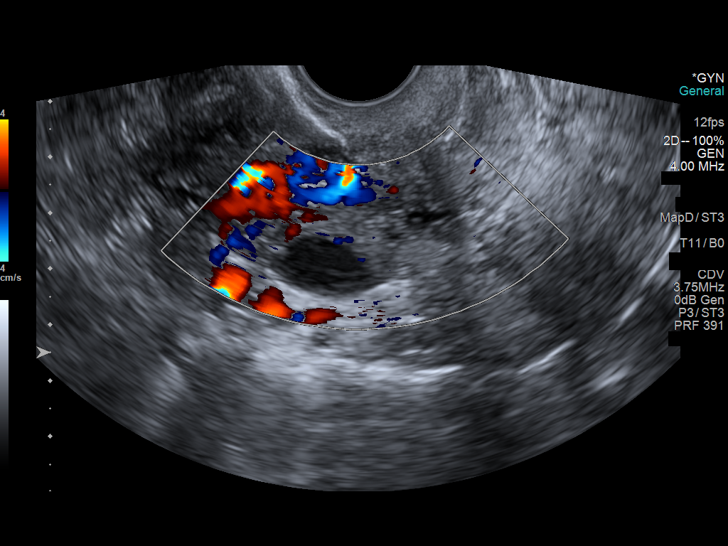
[im 77/81]
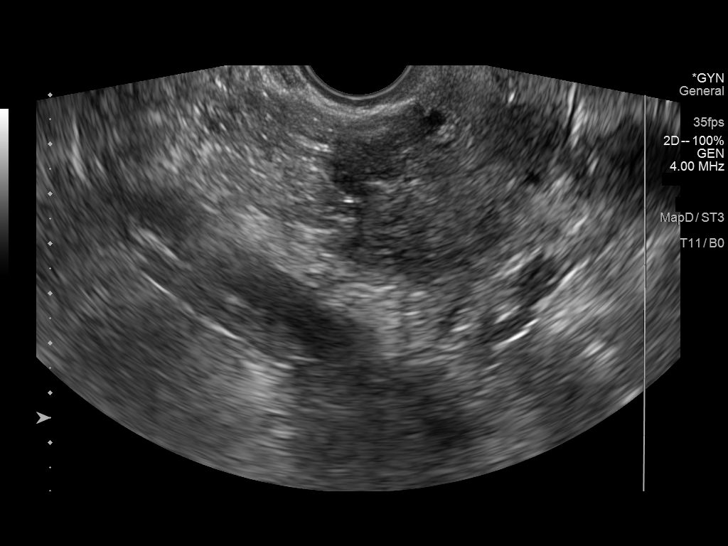

[Series 1001: us pelvis complete with transvaginal · 0.14mm/px · 1 of 2 slices shown (2 of 2)]
[im 1/2]
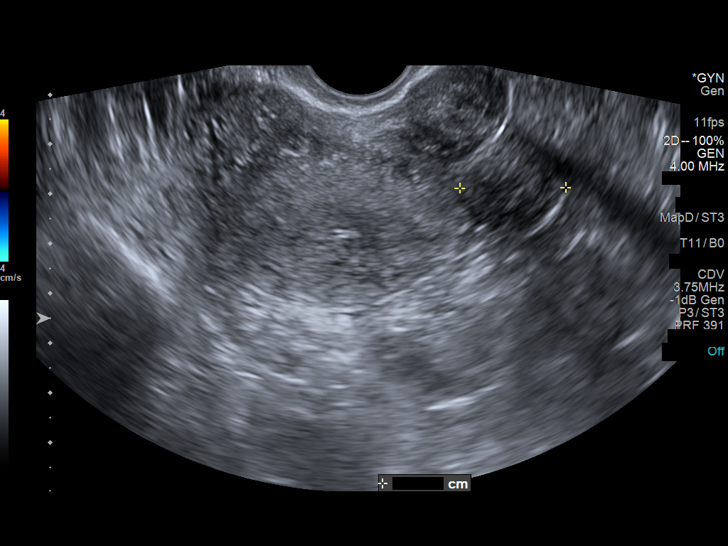

[13 of 25 positions shown; findings below may reference images not displayed]

FINDINGS: Uterus

Measurements: 8.0 x 4.0 x 4.5 cm = volume: 75 mL. Anteverted.
Anterior wall exophytic leiomyoma 3.3 x 2.0 x 2.1 cm, to LEFT.
Additional exophytic leiomyoma upper LEFT uterus 1.9 x 1.5 x 2.1 cm.
Subserosal leiomyoma upper RIGHT uterus 2.4 x 2.1 x 1.7 cm.

Endometrium

Thickness: 3 mm.  No endometrial fluid or mass

Right ovary

Measurements: 3.3 x 1.7 x 2.4 cm = volume: 6.9 mL. Normal morphology
without mass

Left ovary

Measurements: 3.1 x 2.2 x 2.6 cm = volume: 9.1 mL. Normal morphology
without mass

Other findings

No free pelvic fluid.  No adnexal masses.
IMPRESSION: Multiple uterine leiomyomata as above.

Otherwise negative exam.

## 2023-12-28 DIAGNOSIS — F4320 Adjustment disorder, unspecified: Secondary | ICD-10-CM | POA: Diagnosis not present

## 2024-01-04 DIAGNOSIS — F4320 Adjustment disorder, unspecified: Secondary | ICD-10-CM | POA: Diagnosis not present

## 2024-01-16 ENCOUNTER — Ambulatory Visit: Admitting: Nurse Practitioner

## 2024-01-18 DIAGNOSIS — F4320 Adjustment disorder, unspecified: Secondary | ICD-10-CM | POA: Diagnosis not present

## 2024-01-19 ENCOUNTER — Ambulatory Visit: Admitting: Nurse Practitioner

## 2024-01-19 NOTE — Progress Notes (Deleted)
 There were no vitals taken for this visit.   Subjective:    Patient ID: Madison Mejia, female    DOB: [birth year], 38 y.o.   MRN: [id number]  HPI: Madison Mejia is a 38 y.o. female presenting today for 3 month follow-up for weight management. At her last appointment on 10/16/23 was 179 lbs with a BMI of 30.88.     She currently weights   with a BMI of  at her appointment 3 months ago Zepbound  was submitted for prior authorization to her insurance. Patient reports          10/16/2023   10:17 AM 03/03/2023   11:29 AM 07/13/2021    9:27 AM  Depression screen PHQ 2/9  Decreased Interest 0 0 0  Down, Depressed, Hopeless 0 0 0  PHQ - 2 Score 0 0 0  Altered sleeping 0 0 0  Tired, decreased energy 0 2 0  Change in appetite 0 1 0  Feeling bad or failure about yourself  0 0 0  Trouble concentrating 0 0 0  Moving slowly or fidgety/restless 0 0 0  Suicidal thoughts 0 0 0  PHQ-9 Score 0 3 0  Difficult doing work/chores Not difficult at all Not difficult at all Not difficult at all    Relevant past medical, surgical, family and social history reviewed and updated as indicated. Interim medical history since our last visit reviewed. Allergies and medications reviewed and updated.  Review of Systems  Per HPI unless specifically indicated above     Objective:     There were no vitals taken for this visit.  {Vitals History (Optional):23777} Wt Readings from Last 3 Encounters:  10/16/23 179 lb 14.4 oz (81.6 kg)  05/02/23 186 lb (84.4 kg)  03/03/23 191 lb 12.8 oz (87 kg)    Physical Exam   Results for orders placed or performed in visit on 10/16/23  CBC with Differential/Platelet   Collection Time: 10/16/23 10:57 AM  Result Value Ref Range   WBC 7.2 3.8 - 10.8 Thousand/uL   RBC 4.26 3.80 - 5.10 Million/uL   Hemoglobin 12.6 11.7 - 15.5 g/dL   HCT 61.3 64.9 - 54.9 %   MCV 90.6 80.0 - 100.0 fL   MCH 29.6 27.0 - 33.0 pg   MCHC 32.6 32.0 - 36.0 g/dL   RDW 85.8 88.9 - 84.9 %    Platelets 395 140 - 400 Thousand/uL   MPV 10.1 7.5 - 12.5 fL   Neutro Abs 4,111 1,500 - 7,800 cells/uL   Absolute Lymphocytes 2,563 850 - 3,900 cells/uL   Absolute Monocytes 367 200 - 950 cells/uL   Eosinophils Absolute 108 15 - 500 cells/uL   Basophils Absolute 50 0 - 200 cells/uL   Neutrophils Relative % 57.1 %   Total Lymphocyte 35.6 %   Monocytes Relative 5.1 %   Eosinophils Relative 1.5 %   Basophils Relative 0.7 %  Comprehensive metabolic panel with GFR   Collection Time: 10/16/23 10:57 AM  Result Value Ref Range   Glucose, Bld 87 65 - 99 mg/dL   BUN 11 7 - 25 mg/dL   Creat 9.13 9.49 - 9.02 mg/dL   eGFR 90 > OR = 60 fO/fpw/8.26f7   BUN/Creatinine Ratio SEE NOTE: 6 - 22 (calc)   Sodium 143 135 - 146 mmol/L   Potassium 4.6 3.5 - 5.3 mmol/L   Chloride 109 98 - 110 mmol/L   CO2 24 20 - 32 mmol/L   Calcium 9.4 8.6 - 10.2 mg/dL  Total Protein 6.9 6.1 - 8.1 g/dL   Albumin 4.4 3.6 - 5.1 g/dL   Globulin 2.5 1.9 - 3.7 g/dL (calc)   AG Ratio 1.8 1.0 - 2.5 (calc)   Total Bilirubin 0.3 0.2 - 1.2 mg/dL   Alkaline phosphatase (APISO) 59 31 - 125 U/L   AST 13 10 - 30 U/L   ALT 13 6 - 29 U/L  Lipid panel   Collection Time: 10/16/23 10:57 AM  Result Value Ref Range   Cholesterol 210 (H) <200 mg/dL   HDL 44 (L) > OR = 50 mg/dL   Triglycerides 894 <849 mg/dL   LDL Cholesterol (Calc) 144 (H) mg/dL (calc)   Total CHOL/HDL Ratio 4.8 <5.0 (calc)   Non-HDL Cholesterol (Calc) 166 (H) <130 mg/dL (calc)  Hemoglobin J8r   Collection Time: 10/16/23 10:57 AM  Result Value Ref Range   Hgb A1c MFr Bld 5.6 <5.7 %   Mean Plasma Glucose 114 mg/dL   eAG (mmol/L) 6.3 mmol/L  TSH   Collection Time: 10/16/23 10:57 AM  Result Value Ref Range   TSH 1.61 mIU/L   {Labs (Optional):23779}       Assessment & Plan:   Problem List Items Addressed This Visit   None    Assessment and Plan         Follow up plan: No follow-ups on file.

## 2024-02-15 DIAGNOSIS — F4320 Adjustment disorder, unspecified: Secondary | ICD-10-CM | POA: Diagnosis not present
